# Patient Record
Sex: Female | Born: 1955 | Race: Black or African American | Hispanic: No | Marital: Married | Smoking: Current every day smoker
Health system: Southern US, Community
[De-identification: ages and names within clinical notes are randomized; demographics above are authoritative.]

## PROBLEM LIST (undated history)

## (undated) DIAGNOSIS — K219 Gastro-esophageal reflux disease without esophagitis: Secondary | ICD-10-CM

## (undated) DIAGNOSIS — E785 Hyperlipidemia, unspecified: Secondary | ICD-10-CM

## (undated) DIAGNOSIS — I10 Essential (primary) hypertension: Secondary | ICD-10-CM

## (undated) DIAGNOSIS — R7611 Nonspecific reaction to tuberculin skin test without active tuberculosis: Secondary | ICD-10-CM

## (undated) DIAGNOSIS — M1990 Unspecified osteoarthritis, unspecified site: Secondary | ICD-10-CM

## (undated) DIAGNOSIS — J449 Chronic obstructive pulmonary disease, unspecified: Secondary | ICD-10-CM

## (undated) DIAGNOSIS — T7840XA Allergy, unspecified, initial encounter: Secondary | ICD-10-CM

## (undated) HISTORY — DX: Chronic obstructive pulmonary disease, unspecified: J44.9

## (undated) HISTORY — DX: Gastro-esophageal reflux disease without esophagitis: K21.9

## (undated) HISTORY — DX: Essential (primary) hypertension: I10

## (undated) HISTORY — DX: Nonspecific reaction to tuberculin skin test without active tuberculosis: R76.11

## (undated) HISTORY — DX: Hyperlipidemia, unspecified: E78.5

## (undated) HISTORY — DX: Allergy, unspecified, initial encounter: T78.40XA

## (undated) HISTORY — DX: Unspecified osteoarthritis, unspecified site: M19.90

## (undated) HISTORY — PX: ABDOMINAL HYSTERECTOMY: SHX81

## (undated) HISTORY — PX: ANKLE SURGERY: SHX546

---

## 1998-01-17 ENCOUNTER — Emergency Department (HOSPITAL_COMMUNITY): Admission: EM | Admit: 1998-01-17 | Discharge: 1998-01-17 | Payer: Self-pay | Admitting: Emergency Medicine

## 1998-10-30 ENCOUNTER — Other Ambulatory Visit: Admission: RE | Admit: 1998-10-30 | Discharge: 1998-10-30 | Payer: Self-pay | Admitting: Gynecology

## 1999-01-20 ENCOUNTER — Emergency Department (HOSPITAL_COMMUNITY): Admission: EM | Admit: 1999-01-20 | Discharge: 1999-01-20 | Payer: Self-pay

## 1999-04-01 DIAGNOSIS — R7611 Nonspecific reaction to tuberculin skin test without active tuberculosis: Secondary | ICD-10-CM

## 1999-04-01 HISTORY — DX: Nonspecific reaction to tuberculin skin test without active tuberculosis: R76.11

## 2003-11-08 ENCOUNTER — Emergency Department (HOSPITAL_COMMUNITY): Admission: EM | Admit: 2003-11-08 | Discharge: 2003-11-08 | Payer: Self-pay | Admitting: Emergency Medicine

## 2004-07-01 ENCOUNTER — Ambulatory Visit: Payer: Self-pay | Admitting: Internal Medicine

## 2005-03-20 ENCOUNTER — Emergency Department (HOSPITAL_COMMUNITY): Admission: EM | Admit: 2005-03-20 | Discharge: 2005-03-20 | Payer: Self-pay | Admitting: Emergency Medicine

## 2005-09-23 ENCOUNTER — Emergency Department (HOSPITAL_COMMUNITY): Admission: EM | Admit: 2005-09-23 | Discharge: 2005-09-23 | Payer: Self-pay | Admitting: Emergency Medicine

## 2006-03-16 ENCOUNTER — Emergency Department (HOSPITAL_COMMUNITY): Admission: EM | Admit: 2006-03-16 | Discharge: 2006-03-17 | Payer: Self-pay | Admitting: Emergency Medicine

## 2006-04-29 ENCOUNTER — Emergency Department (HOSPITAL_COMMUNITY): Admission: EM | Admit: 2006-04-29 | Discharge: 2006-04-29 | Payer: Self-pay | Admitting: Emergency Medicine

## 2007-09-01 ENCOUNTER — Ambulatory Visit: Payer: Self-pay | Admitting: Cardiology

## 2007-09-01 ENCOUNTER — Observation Stay (HOSPITAL_COMMUNITY): Admission: EM | Admit: 2007-09-01 | Discharge: 2007-09-03 | Payer: Self-pay | Admitting: Emergency Medicine

## 2007-09-02 ENCOUNTER — Encounter: Payer: Self-pay | Admitting: Cardiology

## 2007-09-14 ENCOUNTER — Ambulatory Visit: Payer: Self-pay | Admitting: Cardiology

## 2007-09-14 ENCOUNTER — Ambulatory Visit (HOSPITAL_COMMUNITY): Admission: RE | Admit: 2007-09-14 | Discharge: 2007-09-14 | Payer: Self-pay | Admitting: Cardiology

## 2007-10-28 ENCOUNTER — Ambulatory Visit: Payer: Self-pay | Admitting: Cardiology

## 2007-12-14 ENCOUNTER — Ambulatory Visit: Payer: Self-pay | Admitting: Cardiology

## 2008-02-29 ENCOUNTER — Ambulatory Visit: Payer: Self-pay | Admitting: Cardiology

## 2008-05-30 ENCOUNTER — Ambulatory Visit: Payer: Self-pay | Admitting: Cardiology

## 2008-08-22 ENCOUNTER — Encounter: Payer: Self-pay | Admitting: Cardiology

## 2008-10-27 ENCOUNTER — Encounter (INDEPENDENT_AMBULATORY_CARE_PROVIDER_SITE_OTHER): Payer: Self-pay | Admitting: *Deleted

## 2008-11-28 DIAGNOSIS — I10 Essential (primary) hypertension: Secondary | ICD-10-CM

## 2008-11-28 DIAGNOSIS — E785 Hyperlipidemia, unspecified: Secondary | ICD-10-CM

## 2008-12-19 ENCOUNTER — Ambulatory Visit: Payer: Self-pay | Admitting: Cardiology

## 2008-12-19 DIAGNOSIS — R079 Chest pain, unspecified: Secondary | ICD-10-CM

## 2009-06-25 ENCOUNTER — Telehealth: Payer: Self-pay | Admitting: Cardiology

## 2009-09-25 ENCOUNTER — Telehealth: Payer: Self-pay | Admitting: Cardiology

## 2009-11-30 ENCOUNTER — Ambulatory Visit: Payer: Self-pay | Admitting: Cardiology

## 2010-04-30 NOTE — Progress Notes (Signed)
Summary: pt needs refill   Phone Note Refill Request Message from:  Patient on [facility]  Refills Requested: Medication #1:  HYDROCHLOROTHIAZIDE 25 MG TABS 1 tab once daily Initial call taken by: Omer Jack,  September 25, 2009 4:29 PM    Prescriptions: HYDROCHLOROTHIAZIDE 25 MG TABS (HYDROCHLOROTHIAZIDE) 1 tab once daily  #25 x 11   Entered by:   Danielle Rankin, CMA   Authorized by:   Gaylord Shih, MD, [facility]   Signed by:   Danielle Rankin, CMA on 09/26/2009   Method used:   Faxed to ...       [facility] (retail)             [place], [place]         Ph:        Fax: [phone]   RxID:   [id number]

## 2010-04-30 NOTE — Progress Notes (Signed)
Summary: Calling about medication  Medications Added PROTONIX 40 MG TBEC (PANTOPRAZOLE SODIUM) 1 tab once daily       Phone Note Call from Patient Call back at Home Phone [phone]   Caller: Patient Summary of Call: Pt terturning call baout medication Initial call taken by: Judie Grieve,  June 25, 2009 11:36 AM  Follow-up for Phone Call        Will have Okey Regal call pt back. Pt is returning phone call. Marrion Coy, CNA  June 25, 2009 2:46 PM  Follow-up by: Marrion Coy, CNA,  June 25, 2009 2:46 PM    New/Updated Medications: PROTONIX 40 MG TBEC (PANTOPRAZOLE SODIUM) 1 tab once daily Prescriptions: PROTONIX 40 MG TBEC (PANTOPRAZOLE SODIUM) 1 tab once daily  #30 x 11   Entered by:   Danielle Rankin, CMA   Authorized by:   Gaylord Shih, MD, [facility]   Signed by:   Danielle Rankin, CMA on 06/26/2009   Method used:   Faxed to ...       [facility] (retail)             [place], [place]         Ph:        Fax: [phone]   RxID:   [id number]

## 2010-04-30 NOTE — Assessment & Plan Note (Signed)
Summary: YEARLY F/U  /CY  Medications Added GAS-X 80 MG CHEW (SIMETHICONE) Take 1 tablet  at least twice a day        Visit Type:  1 yr f/u Primary Provider:  No PCP at this time  CC:  palpitations..pt c/o indigestion like feeling and says she takes her medicine for this but it does not help...sob....denies any edema...pt quit smoking about 6 mo ago...pt states she needs to find out why she is so sob lately.  History of Present Illness: Evelyn Mcdaniel comes in today for followup of her hypertension, history of tobacco use which she is now quit for 6 months, history of atypical chest pain.  Her biggest complaint is gas in her chest. She burps belches all day. She tried coming off the proton X. which made this worse.  She denies any reflux symptoms. She has no problems swallowing either foods or liquids. She's had no change in bowel habits and no melena or hematochezia. There is no history of peptic ulcer disease.  She denies any cough. She's had no wheezing or symptoms of aspiration.  Clinical Reports Reviewed:  Nuclear Study:  09/14/2007:  STRESS MYOVIEW:   The patient is a 55 year old female with past medical history of   hypertension and hyperlipidemia who is complaining of chest pain.   This study is performed to exclude ischemia.   The patient exercised for duration of nine minutes on the Bruce   protocol. Her heart rate at rest was 86 and increased to a maximum of   160.  Her blood pressure at rest was 128/94 and increased to 198/104,   which is thought to be a hypertensive response.  There was no chest   pain during the study, but there was dyspnea.  There were no   electrocardiographic changes. The study was terminated secondary to   fatigue.   This is a same-day rest/stress protocol.  30 mCi of Myoview were used   for the stress images and 10 mCi of Myoview were used for the rest   images.   Scintigraphic Results:  The images were reconstructed in the short   axis, as  well as the vertical and horizontal long axis.  The stress   images reveal a small defect at the apex.  However, when compared to   the rest images, there is no significant reversibility noted.  The   gated ejection fraction was 67 % and the wall motion was normal.   FINAL INTEPRETATION:   Stress Myoview with no chest pain and no electrocardiographic   changes.  There was a hypertensive response.  The scintigraphic   results show small prior apical infarct versus apical thinning.   There is no ischemia on this study.  The gated ejection fraction was   67% and the wall motion was normal.    Read By:  Olga Millers,  M.D.   Released By:  Olga Millers,  M.D.   Current Medications (verified): 1)  Hydrochlorothiazide 25 Mg Tabs (Hydrochlorothiazide) .Marland Kitchen.. 1 Tab Once Daily 2)  Aspirin 81 Mg Tbec (Aspirin) .... Take One Tablet By Mouth Daily 3)  Amlodipine Besylate 10 Mg Tabs (Amlodipine Besylate) .Marland Kitchen.. 1 Tab Once Daily 4)  Potassium Chloride Crys Cr 20 Meq Cr-Tabs (Potassium Chloride Crys Cr) .Marland Kitchen.. 1 Tab Once Daily 5)  Coricidin D 2-30-325 Mg Tabs (Chlorphen-Pseudoephed-Apap) .... As Needed 6)  Tylenol Extra Strength 500 Mg Tabs (Acetaminophen) .... As Needed 7)  Protonix 40 Mg Tbec (Pantoprazole Sodium) .Marland KitchenMarland KitchenMarland Kitchen  1 Tab Once Daily  Allergies: 1)  ! Morphine  Past History:  Past Medical History: Last updated: 11/28/2008 HYPERLIPIDEMIA (ICD-272.4) HYPERTENSION, UNSPECIFIED (ICD-401.9)  Past Surgical History: Last updated: 05/01/2008 Abdominal Hysterectomy-Total  Family History: Last updated: 05/01/2008 Family History of Hypertension:   Social History: Last updated: 05/01/2008 Full Time Married  Tobacco Use - No.  Alcohol Use - yes Regular Exercise - no Drug Use - no  Risk Factors: Exercise: no (05/01/2008)  Risk Factors: Smoking Status: never (05/01/2008)  Review of Systems       negative history of present illness  Vital Signs:  Patient profile:   55 year old  female Height:      62 inches Weight:      140.8 pounds BMI:     25.85 Pulse rate:   88 / minute Pulse rhythm:   irregular BP sitting:   122 / 90  (left arm) Cuff size:   large  Vitals Entered By: Danielle Rankin, CMA (November 30, 2009 3:43 PM)  Physical Exam  General:  Well developed, well nourished, in no acute distress. Head:  normocephalic and atraumatic Eyes:  PERRLA/EOM intact; conjunctiva and lids normal. Neck:  Neck supple, no JVD. No masses, thyromegaly or abnormal cervical nodes. Chest Wall:  no deformities or breast masses noted Lungs:  Clear bilaterally to auscultation and percussion. Heart:  PMI nondisplaced, normal S1-S2, no murmur or gallop. Carotids equal bilaterally without bruits. Abdomen:  Bowel sounds positive; abdomen soft and non-tender without masses, organomegaly, or hernias noted. No hepatosplenomegaly. Msk:  Back normal, normal gait. Muscle strength and tone normal. Pulses:  pulses normal in all 4 extremities Extremities:  No clubbing or cyanosis. Neurologic:  Alert and oriented x 3. Skin:  Intact without lesions or rashes. Psych:  Normal affect.   EKG  Procedure date:  11/30/2009  Findings:      normal sinus rhythm, RSR prime V1 and V2, left anterior fascicular block, no changes.  Impression & Recommendations:  Problem # 1:  CHEST PAIN UNSPECIFIED (ICD-786.50) Assessment New I suspect this is just gas with belching. It is clearly not cardiac. I recommended simethicone b.i.d. and continue protonic. She should eat regular small meals 3 times a day and don't skip meals. She needs to minimize caffeine. Her updated medication list for this problem includes:    Aspirin 81 Mg Tbec (Aspirin) .Marland Kitchen... Take one tablet by mouth daily    Amlodipine Besylate 10 Mg Tabs (Amlodipine besylate) .Marland Kitchen... 1 tab once daily  Problem # 2:  HYPERTENSION, UNSPECIFIED (ICD-401.9) Assessment: Improved  Her updated medication list for this problem includes:     Hydrochlorothiazide 25 Mg Tabs (Hydrochlorothiazide) .Marland Kitchen... 1 tab once daily    Aspirin 81 Mg Tbec (Aspirin) .Marland Kitchen... Take one tablet by mouth daily    Amlodipine Besylate 10 Mg Tabs (Amlodipine besylate) .Marland Kitchen... 1 tab once daily  Other Orders: EKG w/ Interpretation (93000)  Patient Instructions: 1)  Your physician recommends that you schedule a follow-up appointment in: 1 year with Dr. Daleen Squibb 2)  Your physician recommends that you continue on your current medications as directed. Please refer to the Current Medication list given to you today. 3)  Your physician discussed the hazards of tobacco use.  Continue  the good job in your smoking cessation. 4)  Remember to eat 3 small meals a day regularly and to REDUCE your CAFFEINE

## 2010-07-26 ENCOUNTER — Emergency Department (HOSPITAL_COMMUNITY): Payer: Self-pay

## 2010-07-26 ENCOUNTER — Emergency Department (HOSPITAL_COMMUNITY)
Admission: EM | Admit: 2010-07-26 | Discharge: 2010-07-27 | Disposition: A | Discharge status: Home / Self Care | Payer: Self-pay | Attending: Emergency Medicine | Admitting: Emergency Medicine

## 2010-07-26 DIAGNOSIS — I10 Essential (primary) hypertension: Secondary | ICD-10-CM | POA: Insufficient documentation

## 2010-07-26 DIAGNOSIS — M25569 Pain in unspecified knee: Secondary | ICD-10-CM | POA: Insufficient documentation

## 2010-07-30 ENCOUNTER — Encounter: Payer: Self-pay | Admitting: Cardiology

## 2010-08-13 NOTE — Discharge Summary (Signed)
Mcdaniel, Evelyn               ACCOUNT NO.:  [id number]   MEDICAL RECORD NO.:  [id number]          PATIENT TYPE:  OBV   LOCATION:  1436                         FACILITY:  [facility]   PHYSICIAN:  Rollene Rotunda, MD, FACCDATE OF BIRTH:  17-Dec-1955   DATE OF ADMISSION:  09/01/2007  DATE OF DISCHARGE:  09/03/2007                               DISCHARGE SUMMARY   PRIMARY CARDIOLOGIST:  Jonelle Sidle, MD   DISCHARGE DIAGNOSIS:  Chest pain.   SECONDARY DIAGNOSES:  1. Hypertensive urgency.  2. Medication noncompliance.  3. Marijuana abuse.  4. A 30-pack-year history of tobacco abuse, quitting about 6 months      ago.  5. Hyperlipidemia.  6. Status post hysterectomy in 1988.   ALLERGIES:  MORPHINE causes hives.   PROCEDURES:  A 2D echocardiogram performed, September 02, 2007, showing an EF  of 65-75% with mild-to-moderate LVH.   HISTORY OF PRESENT ILLNESS:  A 55 year old African-American female with  prior history of hypertension and medication noncompliance, who came off  of all of her medicines about a year ago.  She was in her usual state of  health until the evening prior to admission when she developed what she  felt was indigestion, relieved by Mylanta.  On the morning of admission,  she was in her car and driving, had sudden onset of severe pressure and  vice-like pain in the midsternal area associated with shortness of  breath, dizziness, and diaphoresis.  She drove directly to the emergency  room at [facility], where she was found to be hypertensive with blood  pressure 174/112.  She was treated with aspirin and clonidine with  reduction in blood pressure of 125/95, and she was admitted to the  [facility] for further evaluation.   HOSPITAL COURSE:  1. The patient ruled out for an MI.  2. Echocardiogram on September 02, 2007, which showed normal LV function      without regional wall motion abnormalities.  She has had no      recurrent chest discomfort and has  been initiated on calcium-      channel blocker, diuretic, and ACE inhibitor therapy.  She has      tolerated these medications well and her blood pressures remained      stable.  She will be discharged home today in good condition and we      have arranged for her to have an exercise Myoview on September 14, 2007,      at 10 a.m.   DISCHARGE LABS:  Hemoglobin 14.0, hematocrit 40.5, WBC 6.9, and  platelets 168.  Sodium 138, potassium 3.6, chloride 101, CO2 30, BUN 10,  creatinine 0.79, glucose 87, total bilirubin 0.7, alkaline phosphate 94,  AST 16, ALT 14, total protein 7.1, albumin 4.1, calcium 9.5, magnesium  2.3, CK 71, MB 1.6, troponin I 0.02, total cholesterol 230,  triglycerides 143, HDL 45, LDL 156, and TSH 1.300.  Urine drug screen  was positive for THC.  Urinalysis negative.   DISPOSITION:  The patient is being discharged home today in good  condition.   FOLLOWUP PLANS  AND APPOINTMENT:  She will undergo exercise stress  testing on September 14, 2007, at 10 a.m. at [facility].  She will  follow up with Dr. Lilian Kapur on September 28, 2007, at 2:15 p.m.   DISCHARGE MEDICATIONS:  1. Lotensin 10 mg every day.  2. Amlodipine 5 mg ever day.  3. HCTZ 25 mg every day.  4. Potassium citrate 20 mEq every day.  5. Aspirin 81 mg every day.   OUTSTANDING LAB STUDIES:  None.   DURATION OF DISCHARGE/ENCOUNTER:  Forty minutes including physician  time.      Nicolasa Ducking, ANP      Rollene Rotunda, MD, [facility]  Electronically Signed    CB/MEDQ  D:  09/03/2007  T:  09/04/2007  Job:  [id number]

## 2010-08-13 NOTE — H&P (Signed)
Evelyn Mcdaniel, Evelyn Mcdaniel               ACCOUNT NO.:  [id number]   MEDICAL RECORD NO.:  [id number]          PATIENT TYPE:  OBV   LOCATION:  1436                         FACILITY:  [facility]   PHYSICIAN:  Jonelle Sidle, MD DATE OF BIRTH:  Jan 09, 1956   DATE OF ADMISSION:  09/01/2007  DATE OF DISCHARGE:                              HISTORY & PHYSICAL   PRIMARY CARE PHYSICIAN:  None.   HISTORY OF PRESENT ILLNESS:  This is a 55 year old African American  female with no prior cardiac history, presenting to the ER after  experiencing chest discomfort beginning last night, which she describes  as heartburn.  She took Mylanta, which helped.  This morning, while  driving to the store, she felt severe pressure, vise-like pain from her  ribs pressing in midsternally with associated shortness of breath and  dizziness along with diaphoresis.  She turned off the air conditioner,  rested and drove to the emergency room.  On arrival in the emergency  room, the patient was found to be hypertensive with blood pressure of  174/112.  The patient was given aspirin and started on nitroglycerin  drip and given 1 dose of clonidine 0.1 mg.  The patient after  approximately 1 to 1-1/2 hours, began to feel better and blood pressure  has improved and it is now 125/95.  The patient continues to have a  headache, but pressure in her chest has diminished significantly.  We  are asked to evaluate her further.   REVIEW OF SYSTEMS:  Positive for headache, chest pain, shortness of  breath, no palpitations.  Denies nausea, vomiting, diaphoresis, or  dizziness.  Otherwise negative.   PAST MEDICAL HISTORY:  Hypertension (on no medications).   SOCIAL HISTORY:  The patient lives in [place] with her husband.  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  She is  married, with one son.  A 30-pack-year tobacco use, but stopped  approximately 6 months ago, occasional EtOH.  She denies drug use.   PAST  SURGICAL HISTORY:  Hysterectomy in 1988.   FAMILY HISTORY:  Mother with hypertension.  Father with hypertension.  Brother with a heart murmur and hypertension.   CURRENT LABS:  Sodium 141, potassium 3.6, chloride 104, CO2 30, BUN 11,  creatinine 0.74, and glucose 98.  Hemoglobin 14.0, hematocrit 40.5,  white blood cells 6.9, and platelets 168.  AST 16, ALT 14, total protein  7.1, albumin 4.1, CK 27.3, MB less than 1.0, troponin less than 0.05,  calcium 9.4.  Chest x-ray reveal no active disease.  EKG revealing  normal sinus rhythm, ventricular rate of 84 beats per minute with PR  interval of 0.21.   PHYSICAL EXAMINATION:  VITALS SIGNS:  Blood pressure 125/96, pulse 83,  respirations 16, temperature 98.1, and O2 sat 97% on 2 liters.  HEENT:  Head is normocephalic and atraumatic.  Eyes, PERRLA.  Mucous  membranes, mouth pink and moist.  Tongue is midline.  NECK:  Supple without JVD.  No carotid bruits appreciated.  No  thyromegaly is palpated.  CARDIOVASCULAR:  Regular rate and rhythm  with 1/6 soft systolic murmur  auscultated.  No S4 murmur is auscultated.  Pulses are 2+ and equal  bilaterally without bruits.  LUNGS:  Clear to auscultation.  ABDOMEN:  Soft and nontender.  No bruits are noted.  There is no rebound  or guarding.  EXTREMITIES:  Without clubbing, cyanosis, or edema.  NEUROLOGIC:  Cranial nerves II through XII are grossly intact.   IMPRESSION:  1. Chest pain.  2. Hypertensive urgency.  3. Medication noncompliance.   PLAN:  The patient has been seen and examined by myself and Dr. Nona Dell at [facility] ER.  The patient will be admitted to 24-hour  observation as she is a 55 year old female with tobacco abuse and  uncontrolled hypertension presenting with new onset chest pain.  The  patient will be started on Norvasc 5 mg 1 p.o. daily along with  hydrochlorothiazide 25 mg once a day and potassium.  Plan echocardiogram  for LV function, monitor the blood  pressure response to medications and  wean nitroglycerin.  The patient will likely need outpatient stress  testing if she stabilizes and cardiac markers are normal.  Primary care  follow-up will also be needed.      Bettey Mare. Lyman Bishop, NP      Jonelle Sidle, MD  Electronically Signed    KML/MEDQ  D:  09/01/2007  T:  09/02/2007  Job:  [id number]

## 2010-08-13 NOTE — Assessment & Plan Note (Signed)
[facility]                            CARDIOLOGY OFFICE NOTE   Mcdaniel, Evelyn Mcdaniel                      MRN:          [id number]  DATE:12/19/2008                            DOB:          07-Jan-1956    Evelyn Mcdaniel returns today for followup of her hypertension and atypical  chest pain.   She is still having chest pain several times a day under her left  breast.  She is wondering if she needs a breast exam.  She has not had a  mammogram in 2 years.  I have suggested follow up for that at the same  imaging center she had it done before.  I have also recommended monthly  breast exams.   She denies any radiation or discomfort.  It is not described as anginal  or chest tightness or pressure.  It is not associated with exertion.  She denies any fever or chills.  She has no cough, no hemoptysis.  It  does not sound like pleuritic chest pain.   She continues not to smoke!  She is currently on good blood pressure  medicines and apparently her blood pressure has been under good control.   CURRENT MEDICATIONS:  1. Hydrochlorothiazide 25 mg a day.  2. Aspirin 81 mg a day.  3. Amlodipine 10 mg per day.  4. Mylanta p.r.n. for reflux.  5. Potassium 20 mEq a day.  6. Coricidin as needed.  7. Tussin as needed.  8. Tylenol as needed.   PHYSICAL EXAMINATION:  GENERAL:  She is in no acute distress.  VITAL SIGNS:  Her blood pressure is 100/80 in the left arm, her pulse is  80 and regular, she is 62 inches and weighs 141 pounds.  Her BMI is  25.8.  HEENT:  Unchanged and unremarkable.  NECK:  Supple.  Carotid upstrokes were equal bilaterally without bruits.  There is no thyroid enlargement.  Trachea is midline.  LUNGS:  Clear to  auscultation and percussion without rhonchi or wheezes.  HEART:  Poorly appreciated PMI.  Soft S1 and S2.  No obvious murmur,  rub, or gallop.  ABDOMEN:  Soft, good bowel sounds.  No midline bruit.  No hepatomegaly.  EXTREMITIES:  No  cyanosis, clubbing, or edema.  Pulses are intact.  NEUROLOGIC:  Intact.   Her EKG shows sinus rhythm with biatrial enlargement and pulmonary  disease.  Pattern of left anterior fascicular block unchanged from  before.   ASSESSMENT AND PLAN:  Evelyn Mcdaniel blood pressure is under excellent  control.  Her chest pain is difficult to sort out, but I have  recommended she get a mammogram and followup with her OB/GYN.  We will  see her back in a year or p.r.n.     Thomas C. Daleen Squibb, MD, [facility]  Electronically Signed    TCW/MedQ  DD: 12/19/2008  DT: 12/20/2008  Job #: [id number]

## 2010-08-13 NOTE — Assessment & Plan Note (Signed)
[facility]                            CARDIOLOGY OFFICE NOTE   NAME:Gottsch, Evelyn Mcdaniel                      MRN:          [id number]  DATE:12/14/2007                            DOB:          1955/10/27    Evelyn Mcdaniel returns today for difficulty to control hypertension.  On  her last visit, we increased her amlodipine to 10 mg a day.  We also  wrote her for pantoprazole for reflux symptoms.   Her blood pressure is much better today at 126/85, her pulse is 90 and  regular, and her weight is 128.  Her exam is unchanged.  She has no  peripheral edema.   She is complaining that the medication is making her feel perhaps tired.  She is also worried about it diminishing her sex drive.  I have tried to  reassure her that Lotensin, amlodipine, and hydrochlorothiazide rarely  do this.  I told her that specifically while we did not use a beta  blocker or an alpha blocker.   She will continue with her medications.  She is delighted with blood  pressure control.  We will plan on seeing her back in 3 months.     Thomas C. Daleen Squibb, MD, [facility]  Electronically Signed    TCW/MedQ  DD: 12/14/2007  DT: 12/15/2007  Job #: [id number]

## 2010-08-13 NOTE — Assessment & Plan Note (Signed)
[facility]                            CARDIOLOGY OFFICE NOTE   NAME:Evelyn Mcdaniel, Evelyn Mcdaniel                      MRN:          [id number]  DATE:10/28/2007                            DOB:          [birth year]    Evelyn Mcdaniel comes in today for followup.  She was admitted with chest  pain, ruled out for myocardial infarction.  She was noted to be  hypertensive during her hospital stay with pressures up to 174/112.   She is a smoker.  Her total cholesterol was 230, HDL 45, LDL 156.  TSH  was normal.  Her urine drug screen was positive for THC.   She was placed on Lotensin 10 mg every day, amlodipine 5 mg every day,  HCTZ 25 mg a day, potassium 20 mEq a day and an aspirin 81 mg a day.   She had a stress Myoview, which showed a hypertensive blood pressure  response up to 198/104 at 9 minutes per Bruce protocol.  Her heart rate  achieved 160.  There were no EKG changes.  She had an EF of 67% with  normal wall motion.  There was some mild apical thinning but no  ischemia.   Since discharge, she continues to have a lot of chest pain, which seems  to be related to eating and to burping and belching.  She is also had  some numbness in her left leg and left arm.   She still smokes, but has cut way back.   Her meds are unchanged since her discharge.   PHYSICAL EXAMINATION:  Her blood is pressure still elevated but better  at 144/88, her pulse is 88 and regular.  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  HEENT:  Unchanged.  NECK:  Carotids upstrokes are equal bilateral without bruits, no JVD.  Thyroid is not enlarged.  Trachea is midline.  LUNGS:  Clear.  HEART:  Regular rate and rhythm.  No gallop and no rub.  ABDOMEN:  Soft.  EXTREMITIES:  No edema.  Pulses are intact.   I had a long talk with Evelyn Mcdaniel today.  I have strongly urged to stop  smoking for fear for having some coronary vasospasm or  even some  cerebral spasm.  I have written her a prescription for Chantix and also  increased her amlodipine to 10 mg a day with her hypertension and  hypertensive blood pressure response with a question of spasm as well.  We will continue other medications.  At this time, I would continue  daily aspirin 81 mg a day.   I have also given her pantoprazole 40 mg a day for 4 weeks for her  reflux.   I will see her back at that time.     Thomas C. Daleen Squibb, MD, [facility]  Electronically Signed    TCW/MedQ  DD: 10/28/2007  DT: 10/29/2007  Job #: [id number]

## 2010-08-13 NOTE — Assessment & Plan Note (Signed)
[facility]                            CARDIOLOGY OFFICE NOTE   NAME:Fussner, DANYA SPEARMAN                      MRN:          [id number]  DATE:05/30/2008                            DOB:          [birth year]    Deltha comes in today for followup.  She has had some atypical chest  pain.  She has quit smoking.  She is on Chantix.  She says she feels  remarkably better, has more energy, and less shortness of breath.   Her cough got better off of the Accupril.  She has not filled her Diovan  yet.   Her hypertensive drugs are amlodipine 10 mg per day and  hydrochlorothiazide 25 mg per day.   PHYSICAL EXAMINATION:  VITAL SIGNS:  Her blood pressure today is  excellent at 98/62.  Her pulse is 82 and regular.  Her weight is 139, up  4.  LUNGS:  Clear without rhonchi or wheezes.  HEART:  Regular rate and rhythm.  No gallop.  ABDOMEN:  Soft.  EXTREMITIES:  No edema.  Pulses are intact.  NEUROLOGIC:  Intact.   Her electrocardiogram is unchanged.   I had a long talk with Ms. Dudzinski today.  I am extremely pleased that  she has quit smoking.  She obviously feels better and has had some  positive reinforcement.  Her blood pressure is superb.  I have asked her  not to start the Diovan.  I will see her back in 6 months.     Thomas C. Daleen Squibb, MD, [facility]  Electronically Signed    TCW/MedQ  DD: 05/30/2008  DT: 05/31/2008  Job #: [id number]

## 2010-08-13 NOTE — Assessment & Plan Note (Signed)
[facility]                            CARDIOLOGY OFFICE NOTE   NAME:Mcdaniel, Evelyn PITTER                      MRN:          [id number]  DATE:02/29/2008                            DOB:          [birth year]    [name] comes in today for her hypertension and history of tobacco  use.   She had been having a dry nagging cough.  She has had a cold recently,  but she is really concerned that it could be the Accupril.   She denies any fever, chills, or coughing up any blood.  She does have  some sputum production, which is clear.   Her blood pressure has been under great control.  She has been very  careful with salt, which has made a big difference, she says.   Her current meds are:  1. Accupril 10 mg a day.  2. Amlodipine 10 mg a day.  3. Aspirin 81 mg a day.  4. Potassium 20 mEq a day.  5. Hydrochlorothiazide 25 mg a day.   Her blood pressure today is 126/94, pulse is 80 and regular, weight is  135.  HEENT is unchanged.  She does have a cough that is fairly  productive of clear sputum.  She has some rhonchi that cleared with  coughing.  Neck shows no JVD.  Carotids upstrokes were equal bilateral  without bruits.  No thyromegaly.  Trachea is midline.  Heart reveals a  regular rate and rhythm.  Abdominal exam is soft, good bowel sounds.  Extremities have no edema.  Pulses are intact.  Neuro exam is intact.   I had a long talk with [name] today.  I have made the following  recommendations:  1. Start her Chantix and cut back on smoking, which will help her      cough.  She clearly has chronic bronchitis, which I shared with her      today.  2. Renew her amlodipine.  3. Stop her Accupril.  She will follow her blood pressures.  If her      pressure is under good control, we will just stay off Accupril.      She will note whether or not her cough improves, however.  If her      blood pressure goes back up and her cough has not improved off  Accupril, she will go back on it.   I will plan on seeing her back again in 6 months.     Thomas C. Daleen Squibb, MD, [facility]  Electronically Signed   TCW/MedQ  DD: 02/29/2008  DT: 03/01/2008  Job #: [id number]

## 2010-12-05 ENCOUNTER — Ambulatory Visit: Payer: Self-pay | Admitting: Cardiology

## 2010-12-13 ENCOUNTER — Ambulatory Visit (INDEPENDENT_AMBULATORY_CARE_PROVIDER_SITE_OTHER): Payer: Self-pay | Admitting: Cardiology

## 2010-12-13 ENCOUNTER — Encounter: Payer: Self-pay | Admitting: Cardiology

## 2010-12-13 VITALS — BP 120/82 | HR 76 | Ht 62.0 in | Wt 142.0 lb

## 2010-12-13 DIAGNOSIS — E785 Hyperlipidemia, unspecified: Secondary | ICD-10-CM

## 2010-12-13 DIAGNOSIS — I10 Essential (primary) hypertension: Secondary | ICD-10-CM

## 2010-12-13 LAB — LIPID PANEL
Total CHOL/HDL Ratio: 4
VLDL: 26 mg/dL (ref 0.0–40.0)

## 2010-12-13 LAB — BASIC METABOLIC PANEL
BUN: 11 mg/dL (ref 6–23)
CO2: 28 mEq/L (ref 19–32)
Chloride: 103 mEq/L (ref 96–112)
Creatinine, Ser: 0.6 mg/dL (ref 0.4–1.2)

## 2010-12-13 LAB — HEPATIC FUNCTION PANEL
Alkaline Phosphatase: 88 U/L (ref 39–117)
Bilirubin, Direct: 0 mg/dL (ref 0.0–0.3)
Total Protein: 7.2 g/dL (ref 6.0–8.3)

## 2010-12-13 NOTE — Assessment & Plan Note (Signed)
Blood pressure under good control. No change recommendations. Advised to avoid ibuprofen as much as possible. Check chemistries today including renal function. I've asked her to get a primary care physician since her blood pressure is well controlled now and her blood work can be followed by a good primary care provider. She has agreed to do so. I will see her back p.r.n.

## 2010-12-13 NOTE — Patient Instructions (Signed)
Your physician recommends that you return for lab work today fasting cholesterol,cmp.  We will call you with your lab results  Avoid taking ibuprofen as much as possible.  Your doctor has recommended that you follow-up in the next 3-6 months with a primary care physician.

## 2010-12-13 NOTE — Progress Notes (Signed)
HPI Evelyn Mcdaniel returns for evaluation  And management of her hypertension and hyperlipidemia.  Medications reviewed. She only takes pantoprazole p.r.n. She is taking all of her other medications as prescribed.  She denies any chest discomfort consistent with angina. She does get dyspnea on exertion if she runs. She still smokes but is cut way back. She is to have blood work today.  She does not have a primary care physician. She has no health insurance.  EKG today is normal sinus rhythm with low voltage QRS, left anterior fascicular block Past Medical History  Diagnosis Date  . Hyperlipidemia   . Hypertension     Past Surgical History  Procedure Date  . Abdominal hysterectomy     No family history on file.  History   Social History  . Marital Status: Married    Spouse Name: N/A    Number of Children: N/A  . Years of Education: N/A   Occupational History  . Not on file.   Social History Main Topics  . Smoking status: Current Some Day Smoker  . Smokeless tobacco: Never Used  . Alcohol Use: Yes  . Drug Use: No  . Sexually Active: Not on file   Other Topics Concern  . Not on file   Social History Narrative  . No narrative on file    Allergies  Allergen Reactions  . Morphine     REACTION: hives  . Morphine And Related     Current Outpatient Prescriptions  Medication Sig Dispense Refill  . amLODipine (NORVASC) 10 MG tablet Take 10 mg by mouth daily.        Marland Kitchen aspirin 81 MG EC tablet Take 81 mg by mouth daily.        . hydrochlorothiazide 25 MG tablet Take 25 mg by mouth daily.        . Ibuprofen (ADVIL PO) Take 1 tablet by mouth as needed.        . pantoprazole (PROTONIX) 40 MG tablet Take 40 mg by mouth daily.        . potassium chloride SA (K-DUR,KLOR-CON) 20 MEQ tablet Take 20 mEq by mouth daily.          ROS Negative other than HPI.   PE General Appearance: well developed, well nourished in no acute distress HEENT: symmetrical face, PERRLA, good  dentition  Neck: no JVD, thyromegaly, or adenopathy, trachea midline Chest: symmetric without deformity Cardiac: PMI non-displaced, RRR, normal S1, S2, no gallop or murmur Lung: clear to ausculation and percussion Vascular: all pulses full without bruits  Abdominal: nondistended, nontender, good bowel sounds, no HSM, no bruits Extremities: no cyanosis, clubbing or edema, no sign of DVT, no varicosities  Skin: normal color, no rashes Neuro: alert and oriented x 3, non-focal Pysch: normal affect Filed Vitals:   12/13/10 0928  BP: 120/82  Pulse: 76  Height: 5\' 2"  (1.575 m)  Weight: 142 lb (64.411 kg)    EKG  Labs and Studies Reviewed.   No results found for this basename: WBC, HGB, HCT, MCV, PLT      Chemistry   No results found for this basename: NA, K, CL, CO2, BUN, CREATININE, GLU   No results found for this basename: CALCIUM, ALKPHOS, AST, ALT, BILITOT       No results found for this basename: CHOL   No results found for this basename: HDL   No results found for this basename: LDLCALC   No results found for this basename: TRIG   No  results found for this basename: CHOLHDL   No results found for this basename: HGBA1C   No results found for this basename: ALT, AST, GGT, ALKPHOS, BILITOT   No results found for this basename: TSH

## 2010-12-13 NOTE — Assessment & Plan Note (Signed)
Check labs today. Goal LDL less than 100. We'll also check LFTs.

## 2010-12-23 ENCOUNTER — Encounter: Payer: Self-pay | Admitting: *Deleted

## 2010-12-23 ENCOUNTER — Telehealth: Payer: Self-pay | Admitting: Cardiology

## 2010-12-23 DIAGNOSIS — E785 Hyperlipidemia, unspecified: Secondary | ICD-10-CM

## 2010-12-23 MED ORDER — ATORVASTATIN CALCIUM 20 MG PO TABS: 20.0000 mg | ORAL_TABLET | Freq: Every day | ORAL | Status: DC

## 2010-12-23 NOTE — Telephone Encounter (Signed)
Pt aware of cholesterol results. Atorvastatin 20mg  prescription sent to [facility]. Pt will return for labs on 02/04/11 Copy mailed to pt with lab appt. Mylo Red RN

## 2010-12-23 NOTE — Telephone Encounter (Signed)
Returning your call about tests results

## 2010-12-26 LAB — CBC
HCT: 40.5
MCV: 93.7
Platelets: 168
RDW: 13

## 2010-12-26 LAB — MAGNESIUM: Magnesium: 2.3

## 2010-12-26 LAB — CARDIAC PANEL(CRET KIN+CKTOT+MB+TROPI)
CK, MB: 1.5
CK, MB: 1.6
Relative Index: INVALID
Total CK: 71
Troponin I: 0.02

## 2010-12-26 LAB — BASIC METABOLIC PANEL
GFR calc Af Amer: >60
GFR calc non Af Amer: >60
Potassium: 3.6
Sodium: 138

## 2010-12-26 LAB — DIFFERENTIAL
Basophils Relative: 1
Eosinophils Absolute: 0.1
Eosinophils Relative: 1
Lymphocytes Relative: 30
Monocytes Absolute: 0.5
Monocytes Relative: 8
Neutro Abs: 4.2

## 2010-12-26 LAB — COMPREHENSIVE METABOLIC PANEL
Albumin: 4.1
BUN: 11
Creatinine, Ser: 0.74
Total Protein: 7.1

## 2010-12-26 LAB — POCT CARDIAC MARKERS
CKMB, poc: <1 — ABNORMAL LOW
Myoglobin, poc: 27.3
Operator id: 280141

## 2010-12-26 LAB — CK TOTAL AND CKMB (NOT AT ARMC): Relative Index: INVALID

## 2010-12-26 LAB — URINALYSIS, ROUTINE W REFLEX MICROSCOPIC
Nitrite: NEGATIVE
Specific Gravity, Urine: 1.009
pH: 6.5

## 2010-12-26 LAB — D-DIMER, QUANTITATIVE: D-Dimer, Quant: 0.24

## 2010-12-26 LAB — TSH: TSH: 1.3

## 2010-12-26 LAB — RAPID URINE DRUG SCREEN, HOSP PERFORMED
Cocaine: NOT DETECTED
Opiates: NOT DETECTED

## 2010-12-26 LAB — LIPID PANEL: HDL: 45

## 2011-02-04 ENCOUNTER — Telehealth: Payer: Self-pay | Admitting: Cardiology

## 2011-02-04 ENCOUNTER — Other Ambulatory Visit (INDEPENDENT_AMBULATORY_CARE_PROVIDER_SITE_OTHER): Payer: Self-pay | Admitting: *Deleted

## 2011-02-04 ENCOUNTER — Other Ambulatory Visit: Payer: Self-pay | Admitting: Cardiology

## 2011-02-04 ENCOUNTER — Other Ambulatory Visit: Payer: Self-pay | Admitting: *Deleted

## 2011-02-04 DIAGNOSIS — E785 Hyperlipidemia, unspecified: Secondary | ICD-10-CM

## 2011-02-04 LAB — LIPID PANEL
Cholesterol: 214 mg/dL — ABNORMAL HIGH (ref 0–200)
Total CHOL/HDL Ratio: 4
Triglycerides: 136 mg/dL (ref 0.0–149.0)
VLDL: 27.2 mg/dL (ref 0.0–40.0)

## 2011-02-04 LAB — HEPATIC FUNCTION PANEL
AST: 16 U/L (ref 0–37)
Albumin: 4.1 g/dL (ref 3.5–5.2)

## 2011-02-04 LAB — LDL CHOLESTEROL, DIRECT: Direct LDL: 146.6 mg/dL

## 2011-02-04 MED ORDER — HYDROCHLOROTHIAZIDE 25 MG PO TABS: 25.0000 mg | ORAL_TABLET | Freq: Every day | ORAL | Status: DC

## 2011-02-04 NOTE — Telephone Encounter (Signed)
Pt need refill of hctz 25 mg to [facility] [phone]

## 2011-02-18 ENCOUNTER — Telehealth: Payer: Self-pay | Admitting: Cardiology

## 2011-02-18 NOTE — Telephone Encounter (Signed)
New Msg: Pt call wanting to know results of lab work. Please return pt call to discuss further.

## 2011-02-18 NOTE — Telephone Encounter (Signed)
LMTCB Debbie Peretz Thieme RN  

## 2011-04-03 ENCOUNTER — Other Ambulatory Visit: Payer: Self-pay | Admitting: *Deleted

## 2011-04-03 MED ORDER — AMLODIPINE BESYLATE 10 MG PO TABS: 10.0000 mg | ORAL_TABLET | Freq: Every day | ORAL | Status: DC

## 2011-04-03 MED ORDER — PANTOPRAZOLE SODIUM 40 MG PO TBEC: 40.0000 mg | DELAYED_RELEASE_TABLET | Freq: Every day | ORAL | Status: DC

## 2011-04-16 ENCOUNTER — Other Ambulatory Visit: Payer: Self-pay | Admitting: *Deleted

## 2011-04-16 MED ORDER — PANTOPRAZOLE SODIUM 40 MG PO TBEC: 40.0000 mg | DELAYED_RELEASE_TABLET | Freq: Every day | ORAL | Status: DC

## 2011-05-09 ENCOUNTER — Other Ambulatory Visit: Payer: Self-pay

## 2011-05-09 MED ORDER — PANTOPRAZOLE SODIUM 40 MG PO TBEC: 40.0000 mg | DELAYED_RELEASE_TABLET | Freq: Every day | ORAL | Status: DC

## 2011-05-09 MED ORDER — AMLODIPINE BESYLATE 10 MG PO TABS: 10.0000 mg | ORAL_TABLET | Freq: Every day | ORAL | Status: DC

## 2011-06-10 ENCOUNTER — Other Ambulatory Visit: Payer: Self-pay

## 2011-06-10 MED ORDER — AMLODIPINE BESYLATE 10 MG PO TABS: 10.0000 mg | ORAL_TABLET | Freq: Every day | ORAL | Status: DC

## 2011-08-11 ENCOUNTER — Other Ambulatory Visit: Payer: Self-pay | Admitting: Cardiology

## 2011-08-11 MED ORDER — AMLODIPINE BESYLATE 10 MG PO TABS: 10.0000 mg | ORAL_TABLET | Freq: Every day | ORAL | Status: DC

## 2011-09-30 ENCOUNTER — Other Ambulatory Visit: Payer: Self-pay | Admitting: *Deleted

## 2011-09-30 MED ORDER — ATORVASTATIN CALCIUM 20 MG PO TABS: 20.0000 mg | ORAL_TABLET | Freq: Every day | ORAL | Status: DC

## 2011-09-30 NOTE — Telephone Encounter (Signed)
Pt needs appointment then refill can be made Fax Received. Refill Completed. Jonel Sick Chowoe (R.M.A)   

## 2011-09-30 NOTE — Telephone Encounter (Signed)
Pt needs appointment then refill can be made 

## 2011-11-26 ENCOUNTER — Telehealth: Payer: Self-pay | Admitting: *Deleted

## 2011-11-26 NOTE — Telephone Encounter (Signed)
LMTCB for yearly appt & fasting lab work. Refill for protonix sent in. Mylo Red RN

## 2012-01-01 ENCOUNTER — Other Ambulatory Visit: Payer: Self-pay | Admitting: Cardiology

## 2012-01-01 MED ORDER — ATORVASTATIN CALCIUM 20 MG PO TABS: 20.0000 mg | ORAL_TABLET | Freq: Every day | ORAL | Status: DC

## 2012-02-11 ENCOUNTER — Other Ambulatory Visit: Payer: Self-pay | Admitting: *Deleted

## 2012-02-25 ENCOUNTER — Other Ambulatory Visit: Payer: Self-pay

## 2012-02-25 MED ORDER — ATORVASTATIN CALCIUM 20 MG PO TABS: 20.0000 mg | ORAL_TABLET | Freq: Every day | ORAL | Status: DC

## 2012-03-04 ENCOUNTER — Encounter (HOSPITAL_COMMUNITY): Payer: Self-pay | Admitting: *Deleted

## 2012-03-04 ENCOUNTER — Emergency Department (HOSPITAL_COMMUNITY): Payer: Self-pay

## 2012-03-04 ENCOUNTER — Emergency Department (HOSPITAL_COMMUNITY)
Admission: EM | Admit: 2012-03-04 | Discharge: 2012-03-04 | Disposition: A | Discharge status: Home / Self Care | Payer: Self-pay | Attending: Emergency Medicine | Admitting: Emergency Medicine

## 2012-03-04 DIAGNOSIS — I10 Essential (primary) hypertension: Secondary | ICD-10-CM | POA: Insufficient documentation

## 2012-03-04 DIAGNOSIS — Z79899 Other long term (current) drug therapy: Secondary | ICD-10-CM | POA: Insufficient documentation

## 2012-03-04 DIAGNOSIS — E876 Hypokalemia: Secondary | ICD-10-CM | POA: Insufficient documentation

## 2012-03-04 DIAGNOSIS — R000 Tachycardia, unspecified: Secondary | ICD-10-CM | POA: Insufficient documentation

## 2012-03-04 DIAGNOSIS — F17200 Nicotine dependence, unspecified, uncomplicated: Secondary | ICD-10-CM | POA: Insufficient documentation

## 2012-03-04 DIAGNOSIS — E785 Hyperlipidemia, unspecified: Secondary | ICD-10-CM | POA: Insufficient documentation

## 2012-03-04 LAB — URINALYSIS, ROUTINE W REFLEX MICROSCOPIC
Bilirubin Urine: NEGATIVE
Nitrite: NEGATIVE
Specific Gravity, Urine: 1.017 (ref 1.005–1.030)
Urobilinogen, UA: 1 mg/dL (ref 0.0–1.0)

## 2012-03-04 LAB — COMPREHENSIVE METABOLIC PANEL
ALT: 12 U/L (ref 0–35)
AST: 21 U/L (ref 0–37)
Albumin: 3.9 g/dL (ref 3.5–5.2)
Alkaline Phosphatase: 89 U/L (ref 39–117)
Chloride: 96 mEq/L (ref 96–112)
Potassium: 3 mEq/L — ABNORMAL LOW (ref 3.5–5.1)
Sodium: 134 mEq/L — ABNORMAL LOW (ref 135–145)
Total Bilirubin: 0.2 mg/dL — ABNORMAL LOW (ref 0.3–1.2)

## 2012-03-04 LAB — CBC WITH DIFFERENTIAL/PLATELET
Basophils Absolute: 0 10*3/uL (ref 0.0–0.1)
Basophils Relative: 0 % (ref 0–1)
Hemoglobin: 13.5 g/dL (ref 12.0–15.0)
MCHC: 34.9 g/dL (ref 30.0–36.0)
Monocytes Relative: 11 % (ref 3–12)
Neutro Abs: 5.1 10*3/uL (ref 1.7–7.7)
Neutrophils Relative %: 68 % (ref 43–77)
RDW: 12.7 % (ref 11.5–15.5)

## 2012-03-04 MED ORDER — POTASSIUM CHLORIDE CRYS ER 20 MEQ PO TBCR
40.0000 meq | EXTENDED_RELEASE_TABLET | Freq: Once | ORAL | Status: AC
  Administered 2012-03-04: 40 meq via ORAL
  Filled 2012-03-04: qty 2

## 2012-03-04 MED ORDER — SODIUM CHLORIDE 0.9 % IV SOLN
Freq: Once | INTRAVENOUS | Status: AC
  Administered 2012-03-04: 18:00:00 via INTRAVENOUS

## 2012-03-04 MED ORDER — KETOROLAC TROMETHAMINE 30 MG/ML IJ SOLN
30.0000 mg | Freq: Once | INTRAMUSCULAR | Status: AC
  Administered 2012-03-04: 30 mg via INTRAVENOUS
  Filled 2012-03-04: qty 1

## 2012-03-04 NOTE — ED Provider Notes (Signed)
History     CSN: [id number]  Arrival date & time 03/04/12  1423   First MD Initiated Contact with Patient 03/04/12 1722      Chief Complaint  Patient presents with  . Weakness    (Consider location/radiation/quality/duration/timing/severity/associated sxs/prior treatment) HPI  The patient presents with multiple complaints.  She states that and told to was in her usual state of health.  Since that time she's developed pain in her chest, legs, back.  She notes associated weakness, fevers, chills, nausea.  She denies vomiting or diarrhea.  No relief with OTC medication.  No clear exacerbating or precipitating factors.  Past Medical History  Diagnosis Date  . Hyperlipidemia   . Hypertension     Past Surgical History  Procedure Date  . Abdominal hysterectomy     History reviewed. No pertinent family history.  History  Substance Use Topics  . Smoking status: Current Some Day Smoker    Types: Cigarettes  . Smokeless tobacco: Never Used  . Alcohol Use: Yes    OB History    Grav Para Term Preterm Abortions TAB SAB Ect Mult Living                  Review of Systems  Constitutional:       Per HPI, otherwise negative  HENT:       Per HPI, otherwise negative  Eyes: Negative.   Respiratory:       Per HPI, otherwise negative  Cardiovascular:       Per HPI, otherwise negative  Gastrointestinal: Negative for vomiting.  Genitourinary: Negative.   Musculoskeletal:       Per HPI, otherwise negative  Skin: Negative.   Neurological: Positive for weakness. Negative for syncope.    Allergies  Morphine and Morphine and related  Home Medications   Current Outpatient Rx  Name  Route  Sig  Dispense  Refill  . AMLODIPINE BESYLATE 10 MG PO TABS   Oral   Take 1 tablet (10 mg total) by mouth daily.   90 tablet   1   . ASPIRIN 81 MG PO TBEC   Oral   Take 81 mg by mouth daily.           Marland Kitchen HYDROCHLOROTHIAZIDE 25 MG PO TABS   Oral   Take 1 tablet (25 mg total) by  mouth daily.   90 tablet   3   . PANTOPRAZOLE SODIUM 40 MG PO TBEC   Oral   Take 1 tablet (40 mg total) by mouth daily.   90 tablet   1   . POTASSIUM CHLORIDE CRYS ER 20 MEQ PO TBCR   Oral   Take 20 mEq by mouth daily.             BP 130/94  Pulse 96  Temp 98.6 F (37 C) (Oral)  Resp 16  SpO2 97%  Physical Exam  Nursing note and vitals reviewed. Constitutional: She is oriented to person, place, and time. She appears well-developed and well-nourished. No distress.  HENT:  Head: Normocephalic and atraumatic.  Eyes: Conjunctivae normal and EOM are normal.  Cardiovascular: Regular rhythm.  Tachycardia present.   Pulmonary/Chest: Effort normal and breath sounds normal. No stridor. No respiratory distress.  Abdominal: She exhibits no distension.  Musculoskeletal: She exhibits no edema.  Neurological: She is alert and oriented to person, place, and time. No cranial nerve deficit.  Skin: Skin is warm and dry.  Psychiatric: She has a normal mood and affect.  ED Course  Procedures (including critical care time)  Labs Reviewed  COMPREHENSIVE METABOLIC PANEL - Abnormal; Notable for the following:    Sodium 134 (*)     Potassium 3.0 (*)     Total Bilirubin 0.2 (*)     All other components within normal limits  URINALYSIS, ROUTINE W REFLEX MICROSCOPIC - Abnormal; Notable for the following:    APPearance CLOUDY (*)     Ketones, ur TRACE (*)     All other components within normal limits  CBC WITH DIFFERENTIAL   No results found.   No diagnosis found.  Pulse ox 99% room air normal  6:36 PM Patient informed of results.  She smiles, and appears slightly better. MDM  This is a pleasant female presents with generalized complaints, pain, and on exam is in no distress with unremarkable vital signs site for tachycardia.  Given this, her description of pain, headache, weakness, there suspicion of viral syndrome.  The patient's evaluation is notable for demonstration of  hypokalemia, which was repleted here.  Following provision of this, IV fluids, she felt substantially better.  She was discharged in stable condition.  Gerhard Munch, MD 03/04/12 1949

## 2012-03-04 NOTE — ED Notes (Signed)
Pt c/o generalized weakness, back pain, chest pain, and HA. Reports symptoms started Tuesday.

## 2012-03-04 NOTE — ED Notes (Signed)
Pt observed ambulating to the restroom with no problem.

## 2012-03-11 ENCOUNTER — Other Ambulatory Visit: Payer: Self-pay | Admitting: *Deleted

## 2012-05-12 ENCOUNTER — Ambulatory Visit: Payer: Self-pay | Admitting: Cardiology

## 2012-05-17 ENCOUNTER — Encounter: Payer: Self-pay | Admitting: Physician Assistant

## 2012-05-17 ENCOUNTER — Ambulatory Visit (INDEPENDENT_AMBULATORY_CARE_PROVIDER_SITE_OTHER): Payer: Self-pay | Admitting: Physician Assistant

## 2012-05-17 VITALS — BP 152/94 | HR 82 | Ht 62.0 in | Wt 146.2 lb

## 2012-05-17 DIAGNOSIS — F17200 Nicotine dependence, unspecified, uncomplicated: Secondary | ICD-10-CM

## 2012-05-17 DIAGNOSIS — Z720 Tobacco use: Secondary | ICD-10-CM

## 2012-05-17 DIAGNOSIS — I10 Essential (primary) hypertension: Secondary | ICD-10-CM

## 2012-05-17 DIAGNOSIS — E785 Hyperlipidemia, unspecified: Secondary | ICD-10-CM

## 2012-05-17 DIAGNOSIS — K219 Gastro-esophageal reflux disease without esophagitis: Secondary | ICD-10-CM

## 2012-05-17 DIAGNOSIS — R079 Chest pain, unspecified: Secondary | ICD-10-CM

## 2012-05-17 LAB — BASIC METABOLIC PANEL
BUN: 14 mg/dL (ref 6–23)
GFR: 117.86 mL/min (ref >60.00)
Potassium: 3.7 mEq/L (ref 3.5–5.1)

## 2012-05-17 MED ORDER — HYDROCHLOROTHIAZIDE 25 MG PO TABS: 25.0000 mg | ORAL_TABLET | Freq: Every day | ORAL | Status: DC

## 2012-05-17 MED ORDER — POTASSIUM CHLORIDE CRYS ER 20 MEQ PO TBCR: 20.0000 meq | EXTENDED_RELEASE_TABLET | Freq: Every day | ORAL | Status: DC

## 2012-05-17 NOTE — Patient Instructions (Addendum)
YOU HAVE BEEN GIVEN A PRESCRIPTION FOR HCTZ, AND POTASSIUM   LAB TODAY BMET  REPEAT LAB IN 1 WEEK BMET, WITH FASTING LIPID AND LIVER PANEL; THIS CAN BE DONE THE THE SAME DAY YOU COME IN FOR A BLOOD PRESSURE CHECK WITH THE NURSE  PLEASE SCHEDULE A NURSE VISIT FOR 1 WEEK SAME DAY AS LAB WORK  You have been referred to GASTROENTEROLOGY DX GERD, CHEST PAIN, DYSPHAGIA  You have been referred to [facility] PRIMARY CARE (DR. Felicity Coyer ?) DX HTN, GERD

## 2012-05-17 NOTE — Progress Notes (Signed)
[address], [address] [place], [place]  [address] Phone: [phone], Fax:  [phone]  Date:  05/17/2012   ID:  WILLIAM LASKE, DOB 01-May-1955, MRN [id number]  PCP:  No primary provider on file.  Primary Cardiologist:  Dr. Valera Castle     History of Present Illness: Evelyn Mcdaniel is a 57 y.o. female who returns for follow up.  She has a hx of HTN, HL. Echo 6/09: EF 65-75%, mild to moderate LVH.  Myoview 6/09: Small prior apical infarct versus apical thinning, no ischemia, EF 67%. Last seen by Dr. Daleen Squibb 9/12. Plan was to see her back as needed.  She was seen in the ED in 02/2012 with multiple non-specific complaints including chest pain.  She was dx with a viral syndrome.  Notes she ran out of HCTZ couple mos ago.  She has noted a dull headache over the last couple mos as well.  It is located at bilateral temples.  No scotoma.  Not truly throbbing.  She denies significant changes in breathing.  She is NYHA class II-IIb.  No orthopnea, PND, LE edema.  No syncope.  She continues to have chest pain.  These are brief and are located in anterior chest.  No associated dyspnea, nausea, diaphoresis, arm or jaw pain.  They are not clearly exertional.   Labs (11/12):  LDL 146.6 Labs (12/13):  K 3, creatinine 0.61, ALT 12, Hgb 13.5   Wt Readings from Last 3 Encounters:  05/17/12 146 lb 3.2 oz (66.316 kg)  12/13/10 142 lb (64.411 kg)  11/30/09 140 lb 12.8 oz (63.866 kg)     Past Medical History  Diagnosis Date  . Hyperlipidemia   . Hypertension     Current Outpatient Prescriptions  Medication Sig Dispense Refill  . amLODipine (NORVASC) 10 MG tablet Take 1 tablet (10 mg total) by mouth daily.  90 tablet  1  . aspirin 81 MG EC tablet Take 81 mg by mouth daily.        Marland Kitchen atorvastatin (LIPITOR) 20 MG tablet Take 20 mg by mouth daily.      . pantoprazole (PROTONIX) 40 MG tablet Take 1 tablet (40 mg total) by mouth daily.  90 tablet  1  . hydrochlorothiazide (HYDRODIURIL) 25  MG tablet Take 1 tablet (25 mg total) by mouth daily.  90 tablet  3  . potassium chloride SA (K-DUR,KLOR-CON) 20 MEQ tablet Take 20 mEq by mouth daily.         No current facility-administered medications for this visit.    Allergies:    Allergies  Allergen Reactions  . Morphine     REACTION: hives  . Morphine And Related     Social History:  The patient  reports that she has been smoking Cigarettes.  She has been smoking about 0.00 packs per day. She has never used smokeless tobacco. She reports that  drinks alcohol. She reports that she does not use illicit drugs.   ROS:  Please see the history of present illness.   Notes dysphagia.  No odynophagia.  She has frequent dyspepsia.  No melena or hematochezia.  No unexplained weight loss.  No fevers.  She has a chronic cough.  No purulent sputum.   All other systems reviewed and negative.   PHYSICAL EXAM: VS:  BP 152/94  Pulse 82  Ht 5\' 2"  (1.575 m)  Wt 146 lb 3.2 oz (66.316 kg)  BMI 26.73 kg/m2 Well nourished, well developed, in no acute distress  HEENT: normal Neck: no JVD Cardiac:  normal S1, S2; RRR; no murmur Lungs:  clear to auscultation bilaterally, no wheezing, rhonchi or rales Abd: soft, nontender, no hepatomegaly Ext: no edema Skin: warm and dry Neuro:  CNs 2-12 intact, no focal abnormalities noted  EKG:  NSR, HR 83, LAD, PRWP, no change from prior tracing.     ASSESSMENT AND PLAN:  1. Chest Pain:  Atypical.  Suspect all related to GERD.  She has risk factors.  Will arrange plain ETT to rule out ischemia as a cause. 2. GERD:  She has a lot of indigestion and dysphagia.  She takes PPI twice daily.  Will refer to GI. 3. Hypertension:  Suspect uncontrolled BP cause of headaches.  Restart HCTZ and K+.  Check BMET today and repeat in 1 week.  Check BP at time of labs. 4. Hyperlipidemia:  Check fasting lipids and LFTs next week. 5. Tobacco Abuse:  We discussed Chantix vs nicotine patches.  She only smokes 4-5 cigs per day.   I recommend she try setting a quit date and nicotine patches.   6. Disposition:  Will arrange referral to PCP.  She can follow up with me at the time of her ETT.   Signed, Tereso Newcomer, PA-C  2:59 PM 05/17/2012

## 2012-05-18 ENCOUNTER — Telehealth: Payer: Self-pay | Admitting: *Deleted

## 2012-05-18 NOTE — Telephone Encounter (Addendum)
Pt rtn call re results, pls call [phone]

## 2012-05-18 NOTE — Telephone Encounter (Signed)
LVM with pt's husband who said he will have ptcb for her results.

## 2012-05-18 NOTE — Telephone Encounter (Signed)
Message copied by Tarri Fuller on Tue May 18, 2012 11:35 AM ------      Message from: [place], [place] T      Created: Mon May 17, 2012  5:33 PM       Potassium and kidney function look good.      Continue with current treatment plan.      Tereso Newcomer, PA-C  4:49 PM 02/12/2012 ------

## 2012-05-19 ENCOUNTER — Telehealth: Payer: Self-pay | Admitting: *Deleted

## 2012-05-19 NOTE — Telephone Encounter (Signed)
Message copied by Tarri Fuller on Wed May 19, 2012  9:26 AM ------      Message from: [place], [place] T      Created: Mon May 17, 2012  5:33 PM       Potassium and kidney function look good.      Continue with current treatment plan.      Tereso Newcomer, PA-C  4:49 PM 02/12/2012 ------

## 2012-05-19 NOTE — Telephone Encounter (Signed)
lmom labs ok, no changes to be made 

## 2012-05-24 ENCOUNTER — Ambulatory Visit (INDEPENDENT_AMBULATORY_CARE_PROVIDER_SITE_OTHER): Payer: Self-pay | Admitting: *Deleted

## 2012-05-24 VITALS — BP 148/90 | HR 87 | Ht 62.0 in | Wt 141.8 lb

## 2012-05-24 DIAGNOSIS — I10 Essential (primary) hypertension: Secondary | ICD-10-CM

## 2012-05-24 NOTE — Progress Notes (Signed)
Patient came in today for a BP check.  She does not take her medications until 1pm daily.  So she has not had any medications yet today.  She is going to come back in 1-2 weeks for a GXT with Lilian Coma and have her BP checked at that time

## 2012-05-24 NOTE — Patient Instructions (Addendum)
Discussed with Lilian Coma.  Her GXT was never scheduled.  Will scheduled and have patient come back for that in 1-2 weeks.  When she comes she will take her medications with the hope that her BP will be improved

## 2012-06-03 ENCOUNTER — Other Ambulatory Visit: Payer: Self-pay

## 2012-06-03 ENCOUNTER — Ambulatory Visit (INDEPENDENT_AMBULATORY_CARE_PROVIDER_SITE_OTHER): Payer: Self-pay | Admitting: Physician Assistant

## 2012-06-03 DIAGNOSIS — I10 Essential (primary) hypertension: Secondary | ICD-10-CM

## 2012-06-03 DIAGNOSIS — R079 Chest pain, unspecified: Secondary | ICD-10-CM

## 2012-06-03 MED ORDER — METOPROLOL SUCCINATE ER 25 MG PO TB24: 25.0000 mg | ORAL_TABLET | Freq: Every day | ORAL | Status: DC

## 2012-06-03 NOTE — Procedures (Signed)
Exercise Treadmill Test  Pre-Exercise Testing Evaluation Rhythm: normal sinus  Rate: 89     Test  Exercise Tolerance Test Ordering MD: Tereso Newcomer, PA  Interpreting MD: Tereso Newcomer PA-C  Unique Test No: 1  Treadmill:  1  Indication for ETT: chest pain - rule out ischemia  Contraindication to ETT: No   Stress Modality: exercise - treadmill  Cardiac Imaging Performed: non   Protocol: standard Bruce - maximal  Max BP:  168/100  Max MPHR (bpm):  166 85% MPR (bpm):  141  MPHR obtained (bpm):  142 % MPHR obtained:  86%  Reached 85% MPHR (min:sec):  7:02 Total Exercise Time (min-sec):  7:30  Workload in METS:  8.9 Borg Scale: 17  Reason ETT Terminated:  patient's desire to stop    ST Segment Analysis At Rest: normal ST segments - no evidence of significant ST depression With Exercise: non-specific ST changes  Other Information Arrhythmia:  No Angina during ETT:  present (1) Quality of ETT:  diagnostic  ETT Interpretation:  normal - no evidence of ischemia by ST analysis  Comments: Fair exercise tolerance. She did complain of chest pain. Normal BP response to exercise. No ST-T changes to suggest ischemia.   Recommendations: BP elevated prior to starting.  BP still not at target with Norvasc and HCTZ. Add Toprol XL 25 mg QD. She establishes with PCP soon. She can further follow up on BP with PCP. Follow up with Dr. Valera Castle prn. SignedTereso Newcomer, PA-C  11:42 AM 06/03/2012

## 2012-06-03 NOTE — Patient Instructions (Signed)
Start taking Toprol XL (Metoprolol Succinate) daily for BP.  You can take before bedtime each night. See your new Primary Provider this month and continue follow up on BP with him. Follow up with Dr. Valera Castle or Tereso Newcomer, PA-C as needed.

## 2012-06-07 ENCOUNTER — Telehealth: Payer: Self-pay | Admitting: Cardiology

## 2012-06-07 NOTE — Telephone Encounter (Signed)
New problem     Has medication questions.

## 2012-06-07 NOTE — Telephone Encounter (Signed)
Called patient back. She went to pick up Toprol 25mg  XL at the health dept. And was told they have to change to short acting for now because they do not stock the XL. Health dept can get this from the [facility] program but needs med for now. I spoke with Pharm at [facility] Dept named Olegario Messier and she will fill script with Metoprolol 25mg  one half BID for now until she can get Toprolol XL 25mg . Patient aware to pick up medication today.

## 2012-06-08 ENCOUNTER — Ambulatory Visit: Payer: Self-pay | Admitting: Gastroenterology

## 2012-06-14 ENCOUNTER — Encounter: Payer: Self-pay | Admitting: Internal Medicine

## 2012-06-14 ENCOUNTER — Ambulatory Visit (INDEPENDENT_AMBULATORY_CARE_PROVIDER_SITE_OTHER): Payer: Self-pay

## 2012-06-14 ENCOUNTER — Ambulatory Visit (INDEPENDENT_AMBULATORY_CARE_PROVIDER_SITE_OTHER)
Admission: RE | Admit: 2012-06-14 | Discharge: 2012-06-14 | Disposition: A | Discharge status: Home / Self Care | Payer: Self-pay | Source: Ambulatory Visit | Attending: Internal Medicine | Admitting: Internal Medicine

## 2012-06-14 ENCOUNTER — Ambulatory Visit (INDEPENDENT_AMBULATORY_CARE_PROVIDER_SITE_OTHER): Payer: Self-pay | Admitting: Internal Medicine

## 2012-06-14 VITALS — BP 120/82 | HR 87 | Temp 98.2°F | Resp 16 | Ht 62.0 in | Wt 143.0 lb

## 2012-06-14 DIAGNOSIS — Z23 Encounter for immunization: Secondary | ICD-10-CM

## 2012-06-14 DIAGNOSIS — I10 Essential (primary) hypertension: Secondary | ICD-10-CM

## 2012-06-14 DIAGNOSIS — Z1231 Encounter for screening mammogram for malignant neoplasm of breast: Secondary | ICD-10-CM | POA: Insufficient documentation

## 2012-06-14 DIAGNOSIS — R05 Cough: Secondary | ICD-10-CM

## 2012-06-14 DIAGNOSIS — IMO0001 Reserved for inherently not codable concepts without codable children: Secondary | ICD-10-CM | POA: Insufficient documentation

## 2012-06-14 DIAGNOSIS — Z0000 Encounter for general adult medical examination without abnormal findings: Secondary | ICD-10-CM | POA: Insufficient documentation

## 2012-06-14 DIAGNOSIS — E785 Hyperlipidemia, unspecified: Secondary | ICD-10-CM

## 2012-06-14 DIAGNOSIS — J441 Chronic obstructive pulmonary disease with (acute) exacerbation: Secondary | ICD-10-CM

## 2012-06-14 LAB — CBC WITH DIFFERENTIAL/PLATELET
Basophils Relative: 0.6 % (ref 0.0–3.0)
Eosinophils Relative: 1.2 % (ref 0.0–5.0)
Lymphocytes Relative: 46.8 % — ABNORMAL HIGH (ref 12.0–46.0)
Monocytes Relative: 8.2 % (ref 3.0–12.0)
Neutrophils Relative %: 43.2 % (ref 43.0–77.0)
RBC: 4.26 Mil/uL (ref 3.87–5.11)
WBC: 7.5 10*3/uL (ref 4.5–10.5)

## 2012-06-14 LAB — COMPREHENSIVE METABOLIC PANEL
Albumin: 4.2 g/dL (ref 3.5–5.2)
Alkaline Phosphatase: 105 U/L (ref 39–117)
BUN: 13 mg/dL (ref 6–23)
CO2: 28 mEq/L (ref 19–32)
Calcium: 9.2 mg/dL (ref 8.4–10.5)
Chloride: 98 mEq/L (ref 96–112)
GFR: 106.72 mL/min (ref >60.00)
Glucose, Bld: 94 mg/dL (ref 70–99)
Potassium: 3.4 mEq/L — ABNORMAL LOW (ref 3.5–5.1)

## 2012-06-14 LAB — LIPID PANEL: HDL: 42.7 mg/dL (ref >39.00)

## 2012-06-14 LAB — TSH: TSH: 1.66 u[IU]/mL (ref 0.35–5.50)

## 2012-06-14 MED ORDER — FLUTICASONE FUROATE-VILANTEROL 100-25 MCG/INH IN AEPB: 1.0000 | INHALATION_SPRAY | Freq: Every day | RESPIRATORY_TRACT | Status: DC

## 2012-06-14 NOTE — Patient Instructions (Signed)

## 2012-06-14 NOTE — Progress Notes (Signed)
Subjective:    Patient ID: Evelyn Mcdaniel, female    DOB: 08/13/1955, 57 y.o.   MRN: [id number]  Cough This is a chronic problem. The current episode started more than 1 month ago. The problem has been unchanged. The problem occurs every few hours. The cough is productive of sputum. Associated symptoms include shortness of breath and wheezing. Pertinent negatives include no chest pain, chills, ear congestion, ear pain, fever, headaches, heartburn, hemoptysis, myalgias, nasal congestion, postnasal drip, rash, rhinorrhea, sore throat, sweats or weight loss. Risk factors for lung disease include smoking/tobacco exposure. The treatment provided no relief. There is no history of asthma, bronchitis, COPD or pneumonia.      Review of Systems  Constitutional: Negative for fever, chills, weight loss, diaphoresis, activity change, appetite change, fatigue and unexpected weight change.  HENT: Negative.  Negative for ear pain, sore throat, rhinorrhea and postnasal drip.   Eyes: Negative.   Respiratory: Positive for cough, shortness of breath and wheezing. Negative for apnea, hemoptysis, choking, chest tightness and stridor.   Cardiovascular: Negative for chest pain, palpitations and leg swelling.  Gastrointestinal: Negative for heartburn, nausea, vomiting, abdominal pain, diarrhea and constipation.  Endocrine: Negative.   Genitourinary: Negative.   Musculoskeletal: Positive for arthralgias (diffuse hand pain for 10 years). Negative for myalgias, back pain, joint swelling and gait problem.  Skin: Negative for color change, pallor, rash and wound.  Allergic/Immunologic: Negative.   Neurological: Negative for dizziness, speech difficulty, weakness, light-headedness and headaches.  Hematological: Negative for adenopathy. Does not bruise/bleed easily.  Psychiatric/Behavioral: Negative.        Objective:   Physical Exam  Vitals reviewed. Constitutional: She is oriented to person, place, and time. She  appears well-developed and well-nourished. No distress.  HENT:  Head: Normocephalic and atraumatic.  Mouth/Throat: Oropharynx is clear and moist. No oropharyngeal exudate.  Eyes: Conjunctivae are normal. Right eye exhibits no discharge. Left eye exhibits no discharge. No scleral icterus.  Neck: Normal range of motion. Neck supple. No JVD present. No tracheal deviation present. No thyromegaly present.  Cardiovascular: Normal rate, regular rhythm, normal heart sounds and intact distal pulses.  Exam reveals no gallop and no friction rub.   No murmur heard. Pulmonary/Chest: Effort normal. No accessory muscle usage or stridor. Not tachypneic. No respiratory distress. She has no decreased breath sounds. She has wheezes in the right middle field and the left middle field. She has rhonchi in the right middle field and the left middle field. She has no rales. She exhibits no tenderness.  Abdominal: Soft. Bowel sounds are normal. She exhibits no distension and no mass. There is no tenderness. There is no rebound and no guarding.  Musculoskeletal: Normal range of motion. She exhibits no edema and no tenderness.       Right hand: Normal. She exhibits normal range of motion, no tenderness, no bony tenderness, normal two-point discrimination, normal capillary refill, no deformity, no laceration and no swelling. Normal sensation noted. Normal strength noted.       Left hand: Normal. She exhibits normal range of motion, no tenderness, no bony tenderness, normal two-point discrimination, normal capillary refill, no deformity, no laceration and no swelling. Normal sensation noted. Normal strength noted.  Lymphadenopathy:    She has no cervical adenopathy.  Neurological: She is oriented to person, place, and time.  Skin: Skin is warm and dry. No rash noted. She is not diaphoretic. No erythema. No pallor.  Psychiatric: She has a normal mood and affect. Her behavior is normal. Judgment  and thought content normal.           Assessment & Plan:

## 2012-06-15 ENCOUNTER — Encounter: Payer: Self-pay | Admitting: Internal Medicine

## 2012-06-15 NOTE — Assessment & Plan Note (Signed)
I asked her to quit smoking She will start using [facility]

## 2012-06-15 NOTE — Assessment & Plan Note (Signed)
FLP TSH CMP today 

## 2012-06-15 NOTE — Assessment & Plan Note (Signed)
Her BP is well controlled Today I will check her lytes and renal function 

## 2012-06-15 NOTE — Assessment & Plan Note (Signed)
I will check her CXR to see if she has a mass, pna, emphysema

## 2012-06-24 ENCOUNTER — Telehealth: Payer: Self-pay | Admitting: Gastroenterology

## 2012-06-24 NOTE — Telephone Encounter (Signed)
Message copied by Arna Snipe on Thu Jun 24, 2012  4:13 PM ------      Message from: Donata Duff      Created: Tue Jun 08, 2012  9:32 AM       Do not bill  ------

## 2012-07-14 ENCOUNTER — Ambulatory Visit (HOSPITAL_COMMUNITY): Payer: Self-pay | Attending: Internal Medicine

## 2012-08-10 ENCOUNTER — Other Ambulatory Visit: Payer: Self-pay | Admitting: *Deleted

## 2012-08-10 MED ORDER — AMLODIPINE BESYLATE 10 MG PO TABS: 10.0000 mg | ORAL_TABLET | Freq: Every day | ORAL | Status: DC

## 2013-04-25 ENCOUNTER — Encounter (HOSPITAL_COMMUNITY): Payer: Self-pay | Admitting: Emergency Medicine

## 2013-04-25 ENCOUNTER — Emergency Department (HOSPITAL_COMMUNITY)
Admission: EM | Admit: 2013-04-25 | Discharge: 2013-04-25 | Disposition: A | Discharge status: Home / Self Care | Payer: Self-pay | Attending: Emergency Medicine | Admitting: Emergency Medicine

## 2013-04-25 DIAGNOSIS — I10 Essential (primary) hypertension: Secondary | ICD-10-CM | POA: Insufficient documentation

## 2013-04-25 DIAGNOSIS — F17200 Nicotine dependence, unspecified, uncomplicated: Secondary | ICD-10-CM | POA: Insufficient documentation

## 2013-04-25 DIAGNOSIS — R202 Paresthesia of skin: Secondary | ICD-10-CM

## 2013-04-25 DIAGNOSIS — R079 Chest pain, unspecified: Secondary | ICD-10-CM

## 2013-04-25 DIAGNOSIS — Z79899 Other long term (current) drug therapy: Secondary | ICD-10-CM | POA: Insufficient documentation

## 2013-04-25 DIAGNOSIS — R0602 Shortness of breath: Secondary | ICD-10-CM | POA: Insufficient documentation

## 2013-04-25 DIAGNOSIS — R209 Unspecified disturbances of skin sensation: Secondary | ICD-10-CM | POA: Insufficient documentation

## 2013-04-25 DIAGNOSIS — K219 Gastro-esophageal reflux disease without esophagitis: Secondary | ICD-10-CM | POA: Insufficient documentation

## 2013-04-25 DIAGNOSIS — M6281 Muscle weakness (generalized): Secondary | ICD-10-CM | POA: Insufficient documentation

## 2013-04-25 DIAGNOSIS — G5602 Carpal tunnel syndrome, left upper limb: Secondary | ICD-10-CM

## 2013-04-25 DIAGNOSIS — E785 Hyperlipidemia, unspecified: Secondary | ICD-10-CM | POA: Insufficient documentation

## 2013-04-25 DIAGNOSIS — G5600 Carpal tunnel syndrome, unspecified upper limb: Secondary | ICD-10-CM | POA: Insufficient documentation

## 2013-04-25 DIAGNOSIS — M129 Arthropathy, unspecified: Secondary | ICD-10-CM | POA: Insufficient documentation

## 2013-04-25 DIAGNOSIS — Z7982 Long term (current) use of aspirin: Secondary | ICD-10-CM | POA: Insufficient documentation

## 2013-04-25 DIAGNOSIS — R0789 Other chest pain: Secondary | ICD-10-CM | POA: Insufficient documentation

## 2013-04-25 LAB — COMPREHENSIVE METABOLIC PANEL
ALBUMIN: 4.1 g/dL (ref 3.5–5.2)
ALT: 10 U/L (ref 0–35)
AST: 13 U/L (ref 0–37)
Alkaline Phosphatase: 96 U/L (ref 39–117)
BILIRUBIN TOTAL: 0.3 mg/dL (ref 0.3–1.2)
BUN: 9 mg/dL (ref 6–23)
CALCIUM: 9.5 mg/dL (ref 8.4–10.5)
CO2: 27 meq/L (ref 19–32)
CREATININE: 0.71 mg/dL (ref 0.50–1.10)
Chloride: 99 mEq/L (ref 96–112)
GFR calc Af Amer: >90 mL/min (ref >90)
Glucose, Bld: 88 mg/dL (ref 70–99)
Potassium: 3.2 mEq/L — ABNORMAL LOW (ref 3.7–5.3)
Sodium: 141 mEq/L (ref 137–147)
Total Protein: 7.7 g/dL (ref 6.0–8.3)

## 2013-04-25 LAB — CBC WITH DIFFERENTIAL/PLATELET
BASOS ABS: 0.1 10*3/uL (ref 0.0–0.1)
Basophils Relative: 1 % (ref 0–1)
Eosinophils Absolute: 0.1 10*3/uL (ref 0.0–0.7)
Eosinophils Relative: 1 % (ref 0–5)
HEMATOCRIT: 40.1 % (ref 36.0–46.0)
Hemoglobin: 13.9 g/dL (ref 12.0–15.0)
Lymphocytes Relative: 45 % (ref 12–46)
Lymphs Abs: 2.9 10*3/uL (ref 0.7–4.0)
MCH: 31.7 pg (ref 26.0–34.0)
MCHC: 34.7 g/dL (ref 30.0–36.0)
MCV: 91.3 fL (ref 78.0–100.0)
MONO ABS: 0.5 10*3/uL (ref 0.1–1.0)
Monocytes Relative: 7 % (ref 3–12)
NEUTROS ABS: 2.9 10*3/uL (ref 1.7–7.7)
Neutrophils Relative %: 46 % (ref 43–77)
PLATELETS: 191 10*3/uL (ref 150–400)
RBC: 4.39 MIL/uL (ref 3.87–5.11)
RDW: 12.7 % (ref 11.5–15.5)
WBC: 6.5 10*3/uL (ref 4.0–10.5)

## 2013-04-25 LAB — POCT I-STAT TROPONIN I: Troponin i, poc: 0 ng/mL (ref 0.00–0.08)

## 2013-04-25 MED ORDER — POTASSIUM CHLORIDE CRYS ER 20 MEQ PO TBCR
40.0000 meq | EXTENDED_RELEASE_TABLET | Freq: Once | ORAL | Status: AC
  Administered 2013-04-25: 40 meq via ORAL
  Filled 2013-04-25: qty 2

## 2013-04-25 NOTE — Discharge Instructions (Signed)
There is no signs of a heart attack or any other major abnormality at this time. Keep left wrist splinted at all times except for when bathing. No lifting or doing any repetitive motion with left hand. Follow up with primary care doctor for recheck.    Carpal Tunnel Syndrome The carpal tunnel is a narrow area located on the palm side of your wrist. The tunnel is formed by the wrist bones and ligaments. Nerves, blood vessels, and tendons pass through the carpal tunnel. Repeated wrist motion or certain diseases may cause swelling within the tunnel. This swelling pinches the main nerve in the wrist (median nerve) and causes the painful hand and arm condition called carpal tunnel syndrome. CAUSES   Repeated wrist motions.  Wrist injuries.  Certain diseases like arthritis, diabetes, alcoholism, hyperthyroidism, and kidney failure.  Obesity.  Pregnancy. SYMPTOMS   A "pins and needles" feeling in your fingers or hand.  Tingling or numbness in your fingers or hand.  An aching feeling in your entire arm.  Wrist pain that goes up your arm to your shoulder.  Pain that goes down into your palm or fingers.  A weak feeling in your hands. DIAGNOSIS  Your caregiver will take your history and perform a physical exam. An electromyography test may be needed. This test measures electrical signals sent out by the muscles. The electrical signals are usually slowed by carpal tunnel syndrome. You may also need X-rays. TREATMENT  Carpal tunnel syndrome may clear up by itself. Your caregiver may recommend a wrist splint or medicine such as a nonsteroidal anti-inflammatory medicine. Cortisone injections may help. Sometimes, surgery may be needed to free the pinched nerve.  HOME CARE INSTRUCTIONS   Take all medicine as directed by your caregiver. Only take over-the-counter or prescription medicines for pain, discomfort, or fever as directed by your caregiver.  If you were given a splint to keep your wrist  from bending, wear it as directed. It is important to wear the splint at night. Wear the splint for as long as you have pain or numbness in your hand, arm, or wrist. This may take 1 to 2 months.  Rest your wrist from any activity that may be causing your pain. If your symptoms are work-related, you may need to talk to your employer about changing to a job that does not require using your wrist.  Put ice on your wrist after long periods of wrist activity.  Put ice in a plastic bag.  Place a towel between your skin and the bag.  Leave the ice on for 15-20 minutes, 03-04 times a day.  Keep all follow-up visits as directed by your caregiver. This includes any orthopedic referrals, physical therapy, and rehabilitation. Any delay in getting necessary care could result in a delay or failure of your condition to heal. SEEK IMMEDIATE MEDICAL CARE IF:   You have new, unexplained symptoms.  Your symptoms get worse and are not helped or controlled with medicines. MAKE SURE YOU:   Understand these instructions.  Will watch your condition.  Will get help right away if you are not doing well or get worse. Document Released: 03/14/2000 Document Revised: 06/09/2011 Document Reviewed: 01/31/2011 [facility] Patient Information 2014 [place], [place].

## 2013-04-25 NOTE — ED Provider Notes (Signed)
CSN: [id number]     Arrival date & time 04/25/13  1027 History   First MD Initiated Contact with Patient 04/25/13 1133     Chief Complaint  Patient presents with  . Shortness of Breath  . Chest Pain   (Consider location/radiation/quality/duration/timing/severity/associated sxs/prior Treatment) HPI Evelyn Mcdaniel is a 58 y.o. female who presents to emergency department complaining of left arm numbness, tingling, cramping. Patient reports tightness over left upper arm and forearm, and states she has intermittent left fingers tingling and weakness. She states this comes and goes. She states her last several minutes at a time. She states it happens multiple times a day. Has been going on for weeks. Has not seen her doctor for this. She states when her symptoms begin, starts with tingling in her thumb and spreads to the first 3 fingers and then to the rest of the fingers. Patient states that her first 3 fingers become weak and she is unable to grip anything with them. She also reports intermittent chest pain, shortness of breath, rapid heart rate but states it's been going on for months, she has seen her doctor for it, and she was told it could be acid reflux. She states her chest pain has not changed. She states her main concern right now is her left arm tingling and pain.   Past Medical History  Diagnosis Date  . Hyperlipidemia   . Hypertension   . GERD (gastroesophageal reflux disease)   . Arthritis   . PPD positive 2001  . Allergy    Past Surgical History  Procedure Laterality Date  . Abdominal hysterectomy     Family History  Problem Relation Age of Onset  . Alcohol abuse Father   . Depression Father   . Hypertension Mother   . Arthritis Mother   . Cancer Neg Hx   . Diabetes Neg Hx   . Early death Neg Hx   . Heart disease Neg Hx   . Hyperlipidemia Neg Hx   . Kidney disease Neg Hx   . Stroke Neg Hx    History  Substance Use Topics  . Smoking status: Current Some Day Smoker --  1.00 packs/day for 25 years    Types: Cigarettes  . Smokeless tobacco: Never Used  . Alcohol Use: No   OB History   Grav Para Term Preterm Abortions TAB SAB Ect Mult Living                 Review of Systems  Constitutional: Negative for fever and chills.  Respiratory: Positive for chest tightness and shortness of breath. Negative for cough.   Cardiovascular: Positive for chest pain. Negative for palpitations and leg swelling.  Gastrointestinal: Negative for nausea, vomiting, abdominal pain and diarrhea.  Genitourinary: Negative for dysuria, flank pain, vaginal bleeding, vaginal discharge, vaginal pain and pelvic pain.  Musculoskeletal: Negative for arthralgias, myalgias, neck pain and neck stiffness.  Skin: Negative for rash.  Neurological: Positive for weakness and numbness. Negative for dizziness and headaches.  All other systems reviewed and are negative.    Allergies  Morphine and Morphine and related  Home Medications   Current Outpatient Rx  Name  Route  Sig  Dispense  Refill  . amLODipine (NORVASC) 10 MG tablet   Oral   Take 1 tablet (10 mg total) by mouth daily.   90 tablet   1   . aspirin 81 MG EC tablet   Oral   Take 81 mg by mouth daily.           Marland Kitchen  atorvastatin (LIPITOR) 20 MG tablet   Oral   Take 20 mg by mouth daily.         . cholecalciferol (VITAMIN D) 400 UNITS TABS   Oral   Take 400 Units by mouth daily.         . fish oil-omega-3 fatty acids 1000 MG capsule   Oral   Take 1 g by mouth daily.         . Fluticasone Furoate-Vilanterol (BREO ELLIPTA) 100-25 MCG/INH AEPB   Inhalation   Inhale 1 Act into the lungs daily.   178 each   0   . hydrochlorothiazide (HYDRODIURIL) 25 MG tablet   Oral   Take 1 tablet (25 mg total) by mouth daily.   90 tablet   3   . metoprolol succinate (TOPROL-XL) 25 MG 24 hr tablet   Oral   Take 12.5 mg by mouth 2 (two) times daily.         . pantoprazole (PROTONIX) 40 MG tablet   Oral   Take 1  tablet (40 mg total) by mouth daily.   90 tablet   1   . potassium chloride SA (K-DUR,KLOR-CON) 20 MEQ tablet   Oral   Take 1 tablet (20 mEq total) by mouth daily.   90 tablet   3    BP 117/77  Pulse 102  Temp(Src) 97.8 F (36.6 C) (Oral)  Resp 18  SpO2 98% Physical Exam  Nursing note and vitals reviewed. Constitutional: She is oriented to person, place, and time. She appears well-developed and well-nourished. No distress.  HENT:  Head: Normocephalic.  Eyes: Conjunctivae are normal.  Neck: Neck supple.  Cardiovascular: Normal rate, regular rhythm and normal heart sounds.   Pulmonary/Chest: Effort normal and breath sounds normal. No respiratory distress. She has no wheezes. She has no rales.  Abdominal: Soft. Bowel sounds are normal. She exhibits no distension. There is no tenderness. There is no rebound.  Musculoskeletal: She exhibits no edema.  Full ROM of the neck. No midline or perivertebral tenderness. Full rom of shoulder, left elbow, wrist, all fingers.   Neurological: She is alert and oriented to person, place, and time. No cranial nerve deficit. Coordination normal.  Tingling and "different" sensation in the palmar surface of fingers 1, 2, and 3. No strength deficit currently on exam. Pt able to make thumbs up, touch thumb to other fingers, spread all fingers, make a fist. No wrist weakness with flexion or extension against resistance. LE strength intact  Skin: Skin is warm and dry.  Psychiatric: She has a normal mood and affect. Her behavior is normal.    ED Course  Procedures (including critical care time) Labs Review Labs Reviewed  COMPREHENSIVE METABOLIC PANEL - Abnormal; Notable for the following:    Potassium 3.2 (*)    All other components within normal limits  CBC WITH DIFFERENTIAL  POCT I-STAT TROPONIN I   Imaging Review No results found.  EKG Interpretation   None       MDM   1. Paresthesia   2. Carpal tunnel syndrome of left wrist   3.  Chest pain     Pt with chest pain, which has been there for "months" and is not any different. It is atypical. Pt has had work up for this pain including an echo and ETT in the last few months by [facility]. Pt's main complaint is tingling and weakness in left hand that has been there for weeks. Will get labs, Trop, C  Discussed pt with neurology, Dr. Leonel Ramsay who based on her history and exam findings believes this may be carpal tunnel. Unlikely TIA or stroke given symptoms come and go throughout the day, and has been gone for several weeks. i went back and spoke with pt, she does admit waking up in the middle of the night with these symptoms, also positive tinel sign. Will splint with velcro splint. Follow up with PCP. pts's labs, trop, all negative. Pt reassured. Advised to return if worsening.   Filed Vitals:   04/25/13 1300 04/25/13 1316 04/25/13 1330 04/25/13 1357  BP: 132/88 132/88 116/95 116/95  Pulse: 92 96 94 94  Temp:      TempSrc:      Resp: 19 20 17 20   SpO2: 98% 98% 97% 100%      Jelesa Mangini A Camella Seim, PA-C 04/25/13 1719

## 2013-04-25 NOTE — ED Notes (Signed)
Pt is here with intermittent episodes of shortness of breath, chest pain and left arm tingling, rapid heart rate, and pt reports left arm locking up.

## 2013-04-26 NOTE — ED Provider Notes (Signed)
Medical screening examination/treatment/procedure(s) were performed by non-physician practitioner and as supervising physician I was immediately available for consultation/collaboration.  EKG Interpretation   None        Jasper Riling. Alvino Chapel, MD 04/26/13 1616

## 2013-06-07 ENCOUNTER — Other Ambulatory Visit: Payer: Self-pay

## 2013-06-07 MED ORDER — HYDROCHLOROTHIAZIDE 25 MG PO TABS: 25.0000 mg | ORAL_TABLET | Freq: Every day | ORAL | Status: DC

## 2014-02-08 LAB — HM DIABETES EYE EXAM

## 2014-04-10 ENCOUNTER — Ambulatory Visit (INDEPENDENT_AMBULATORY_CARE_PROVIDER_SITE_OTHER): Payer: Medicaid Other

## 2014-04-10 ENCOUNTER — Other Ambulatory Visit (HOSPITAL_COMMUNITY)
Admission: RE | Admit: 2014-04-10 | Discharge: 2014-04-10 | Disposition: A | Discharge status: Home / Self Care | Payer: Medicaid Other | Source: Ambulatory Visit | Attending: Internal Medicine | Admitting: Internal Medicine

## 2014-04-10 ENCOUNTER — Encounter: Payer: Self-pay | Admitting: Gastroenterology

## 2014-04-10 ENCOUNTER — Other Ambulatory Visit: Payer: Medicaid Other

## 2014-04-10 ENCOUNTER — Encounter: Payer: Self-pay | Admitting: Internal Medicine

## 2014-04-10 ENCOUNTER — Ambulatory Visit (INDEPENDENT_AMBULATORY_CARE_PROVIDER_SITE_OTHER): Payer: Medicaid Other | Admitting: Internal Medicine

## 2014-04-10 ENCOUNTER — Other Ambulatory Visit (INDEPENDENT_AMBULATORY_CARE_PROVIDER_SITE_OTHER): Payer: Medicaid Other

## 2014-04-10 VITALS — BP 132/88 | HR 60 | Temp 98.1°F | Resp 16 | Ht 62.0 in | Wt 138.0 lb

## 2014-04-10 DIAGNOSIS — Z1231 Encounter for screening mammogram for malignant neoplasm of breast: Secondary | ICD-10-CM

## 2014-04-10 DIAGNOSIS — Z01419 Encounter for gynecological examination (general) (routine) without abnormal findings: Secondary | ICD-10-CM | POA: Diagnosis not present

## 2014-04-10 DIAGNOSIS — Z0000 Encounter for general adult medical examination without abnormal findings: Secondary | ICD-10-CM | POA: Diagnosis not present

## 2014-04-10 DIAGNOSIS — Z124 Encounter for screening for malignant neoplasm of cervix: Secondary | ICD-10-CM | POA: Insufficient documentation

## 2014-04-10 DIAGNOSIS — H00022 Hordeolum internum right lower eyelid: Secondary | ICD-10-CM | POA: Insufficient documentation

## 2014-04-10 DIAGNOSIS — I10 Essential (primary) hypertension: Secondary | ICD-10-CM

## 2014-04-10 DIAGNOSIS — E785 Hyperlipidemia, unspecified: Secondary | ICD-10-CM

## 2014-04-10 DIAGNOSIS — J449 Chronic obstructive pulmonary disease, unspecified: Secondary | ICD-10-CM

## 2014-04-10 DIAGNOSIS — IMO0001 Reserved for inherently not codable concepts without codable children: Secondary | ICD-10-CM

## 2014-04-10 DIAGNOSIS — Z1151 Encounter for screening for human papillomavirus (HPV): Secondary | ICD-10-CM | POA: Diagnosis present

## 2014-04-10 DIAGNOSIS — Z23 Encounter for immunization: Secondary | ICD-10-CM

## 2014-04-10 LAB — COMPREHENSIVE METABOLIC PANEL
ALT: 12 U/L (ref 0–35)
AST: 15 U/L (ref 0–37)
Albumin: 4.5 g/dL (ref 3.5–5.2)
Alkaline Phosphatase: 103 U/L (ref 39–117)
BUN: 10 mg/dL (ref 6–23)
CO2: 28 meq/L (ref 19–32)
Calcium: 9.4 mg/dL (ref 8.4–10.5)
Chloride: 104 mEq/L (ref 96–112)
Creatinine, Ser: 0.7 mg/dL (ref 0.4–1.2)
GFR: 111.27 mL/min (ref >60.00)
GLUCOSE: 93 mg/dL (ref 70–99)
POTASSIUM: 3.7 meq/L (ref 3.5–5.1)
Sodium: 140 mEq/L (ref 135–145)
Total Bilirubin: 0.7 mg/dL (ref 0.2–1.2)
Total Protein: 8 g/dL (ref 6.0–8.3)

## 2014-04-10 LAB — CBC WITH DIFFERENTIAL/PLATELET
BASOS PCT: 0.7 % (ref 0.0–3.0)
Basophils Absolute: 0 10*3/uL (ref 0.0–0.1)
EOS ABS: 0.1 10*3/uL (ref 0.0–0.7)
EOS PCT: 1.8 % (ref 0.0–5.0)
HCT: 43.3 % (ref 36.0–46.0)
Hemoglobin: 14.3 g/dL (ref 12.0–15.0)
Lymphocytes Relative: 36 % (ref 12.0–46.0)
Lymphs Abs: 2.3 10*3/uL (ref 0.7–4.0)
MCHC: 33.1 g/dL (ref 30.0–36.0)
MCV: 93 fl (ref 78.0–100.0)
Monocytes Absolute: 0.5 10*3/uL (ref 0.1–1.0)
Monocytes Relative: 8.3 % (ref 3.0–12.0)
NEUTROS ABS: 3.5 10*3/uL (ref 1.4–7.7)
Neutrophils Relative %: 53.2 % (ref 43.0–77.0)
Platelets: 189 10*3/uL (ref 150.0–400.0)
RBC: 4.65 Mil/uL (ref 3.87–5.11)
RDW: 12.9 % (ref 11.5–15.5)
WBC: 6.5 10*3/uL (ref 4.0–10.5)

## 2014-04-10 LAB — LIPID PANEL
CHOLESTEROL: 285 mg/dL — AB (ref 0–200)
HDL: 39.6 mg/dL (ref >39.00)
LDL Cholesterol: 207 mg/dL — ABNORMAL HIGH (ref 0–99)
NonHDL: 245.4
Total CHOL/HDL Ratio: 7
Triglycerides: 191 mg/dL — ABNORMAL HIGH (ref 0.0–149.0)
VLDL: 38.2 mg/dL (ref 0.0–40.0)

## 2014-04-10 LAB — TSH: TSH: 0.93 u[IU]/mL (ref 0.35–4.50)

## 2014-04-10 LAB — HEPATITIS C ANTIBODY: HCV AB: NEGATIVE

## 2014-04-10 MED ORDER — HYDROCHLOROTHIAZIDE 25 MG PO TABS: 25.0000 mg | ORAL_TABLET | Freq: Every day | ORAL | Status: DC

## 2014-04-10 MED ORDER — METOPROLOL SUCCINATE ER 25 MG PO TB24: 12.5000 mg | ORAL_TABLET | Freq: Two times a day (BID) | ORAL | Status: AC

## 2014-04-10 MED ORDER — PANTOPRAZOLE SODIUM 40 MG PO TBEC: 40.0000 mg | DELAYED_RELEASE_TABLET | Freq: Every day | ORAL | Status: AC

## 2014-04-10 MED ORDER — POTASSIUM CHLORIDE CRYS ER 20 MEQ PO TBCR: 20.0000 meq | EXTENDED_RELEASE_TABLET | Freq: Every day | ORAL | Status: AC

## 2014-04-10 MED ORDER — AMLODIPINE BESYLATE 10 MG PO TABS: 10.0000 mg | ORAL_TABLET | Freq: Every day | ORAL | Status: AC

## 2014-04-10 MED ORDER — FLUTICASONE FUROATE-VILANTEROL 100-25 MCG/INH IN AEPB: 1.0000 | INHALATION_SPRAY | Freq: Every day | RESPIRATORY_TRACT | Status: AC

## 2014-04-10 MED ORDER — ATORVASTATIN CALCIUM 20 MG PO TABS: 20.0000 mg | ORAL_TABLET | Freq: Every day | ORAL | Status: DC

## 2014-04-10 NOTE — Progress Notes (Signed)
Subjective:    Patient ID: Evelyn Mcdaniel, female    DOB: [birth year], 57 y.o.   MRN: [id number]  Hypertension This is a chronic problem. The current episode started more than 1 year ago. The problem is unchanged. The problem is controlled. Pertinent negatives include no anxiety, blurred vision, chest pain, headaches, malaise/fatigue, neck pain, orthopnea, palpitations, peripheral edema, PND, shortness of breath or sweats. Risk factors for coronary artery disease include smoking/tobacco exposure. Past treatments include beta blockers, diuretics and calcium channel blockers. The current treatment provides moderate improvement. There are no compliance problems.       Review of Systems  Constitutional: Negative.  Negative for fever, chills, malaise/fatigue, diaphoresis, appetite change and fatigue.  HENT: Negative.   Eyes: Negative.  Negative for blurred vision, photophobia, pain, discharge, redness, itching and visual disturbance.       She has a recurrent stye on her rt lower eyelid, she saw an eye doctor about this 3 months ago and was given an oral antibiotic but she feels like the area is still bothering her with some swelling   Respiratory: Negative.  Negative for cough, choking, chest tightness, shortness of breath and stridor.   Cardiovascular: Negative.  Negative for chest pain, palpitations, orthopnea, leg swelling and PND.  Gastrointestinal: Negative.  Negative for nausea, vomiting, abdominal pain, diarrhea, constipation and blood in stool.  Endocrine: Negative.   Genitourinary: Negative.   Musculoskeletal: Negative.  Negative for arthralgias and neck pain.  Skin: Negative.  Negative for rash.  Allergic/Immunologic: Negative.   Neurological: Negative.  Negative for dizziness, tremors, weakness, numbness and headaches.  Hematological: Negative.  Negative for adenopathy. Does not bruise/bleed easily.  Psychiatric/Behavioral: Negative.        Objective:   Physical Exam    Constitutional: She is oriented to person, place, and time. She appears well-developed and well-nourished. No distress.  HENT:  Head: Normocephalic and atraumatic.  Mouth/Throat: Oropharynx is clear and moist. No oropharyngeal exudate.  Eyes: Conjunctivae and EOM are normal. Pupils are equal, round, and reactive to light. Right eye exhibits no discharge and no exudate. Left eye exhibits no discharge and no exudate. Right conjunctiva is not injected. Left conjunctiva is not injected. No scleral icterus.    Neck: Normal range of motion. Neck supple. No JVD present. No tracheal deviation present. No thyromegaly present.  Cardiovascular: Normal rate, regular rhythm, normal heart sounds and intact distal pulses.  Exam reveals no gallop.   No murmur heard. Pulmonary/Chest: Effort normal and breath sounds normal. No accessory muscle usage or stridor. No respiratory distress. She has no wheezes. She has no rales. She exhibits no tenderness.  Abdominal: Soft. Bowel sounds are normal. She exhibits no distension and no mass. There is no tenderness. There is no rebound and no guarding. Hernia confirmed negative in the right inguinal area and confirmed negative in the left inguinal area.  Genitourinary: Vagina normal. Rectal exam shows internal hemorrhoid. Rectal exam shows no external hemorrhoid, no fissure, no mass, no tenderness and anal tone normal. Guaiac negative stool. No breast swelling, tenderness, discharge or bleeding. Pelvic exam was performed with patient supine. No labial fusion. There is no rash, tenderness, lesion or injury on the right labia. There is no rash, tenderness, lesion or injury on the left labia. Cervix exhibits no discharge. Right adnexum displays no mass, no tenderness and no fullness. Left adnexum displays no mass, no tenderness and no fullness. No erythema, tenderness or bleeding in the vagina. No foreign body around the vagina.  No signs of injury around the vagina. No vaginal  discharge found.  Musculoskeletal: Normal range of motion. She exhibits no edema or tenderness.  Lymphadenopathy:    She has no cervical adenopathy.       Right: No inguinal adenopathy present.       Left: No inguinal adenopathy present.  Neurological: She is oriented to person, place, and time.  Skin: Skin is warm and dry. No rash noted. She is not diaphoretic. No erythema. No pallor.  Psychiatric: She has a normal mood and affect. Her behavior is normal. Judgment and thought content normal.  Vitals reviewed.    Lab Results  Component Value Date   WBC 6.5 04/25/2013   HGB 13.9 04/25/2013   HCT 40.1 04/25/2013   PLT 191 04/25/2013   GLUCOSE 88 04/25/2013   CHOL 235* 06/14/2012   TRIG 205.0* 06/14/2012   HDL 42.70 06/14/2012   LDLDIRECT 146.2 06/14/2012   LDLCALC * 09/02/2007    156        Total Cholesterol/HDL:CHD Risk Coronary Heart Disease Risk Table                     Men   Women  1/2 Average Risk   3.4   3.3   ALT 10 04/25/2013   AST 13 04/25/2013   NA 141 04/25/2013   K 3.2* 04/25/2013   CL 99 04/25/2013   CREATININE 0.71 04/25/2013   BUN 9 04/25/2013   CO2 27 04/25/2013   TSH 1.66 06/14/2012       Assessment & Plan:

## 2014-04-10 NOTE — Progress Notes (Signed)
Pre visit review using our clinic review tool, if applicable. No additional management support is needed unless otherwise documented below in the visit note. 

## 2014-04-10 NOTE — Patient Instructions (Signed)
Preventive Care for Adults A healthy lifestyle and preventive care can promote health and wellness. Preventive health guidelines for women include the following key practices.  A routine yearly physical is a good way to check with your health care provider about your health and preventive screening. It is a chance to share any concerns and updates on your health and to receive a thorough exam.  Visit your dentist for a routine exam and preventive care every 6 months. Brush your teeth twice a day and floss once a day. Good oral hygiene prevents tooth decay and gum disease.  The frequency of eye exams is based on your age, health, family medical history, use of contact lenses, and other factors. Follow your health care provider's recommendations for frequency of eye exams.  Eat a healthy diet. Foods like vegetables, fruits, whole grains, low-fat dairy products, and lean protein foods contain the nutrients you need without too many calories. Decrease your intake of foods high in solid fats, added sugars, and salt. Eat the right amount of calories for you.Get information about a proper diet from your health care provider, if necessary.  Regular physical exercise is one of the most important things you can do for your health. Most adults should get at least 150 minutes of moderate-intensity exercise (any activity that increases your heart rate and causes you to sweat) each week. In addition, most adults need muscle-strengthening exercises on 2 or more days a week.  Maintain a healthy weight. The body mass index (BMI) is a screening tool to identify possible weight problems. It provides an estimate of body fat based on height and weight. Your health care provider can find your BMI and can help you achieve or maintain a healthy weight.For adults 20 years and older:  A BMI below 18.5 is considered underweight.  A BMI of 18.5 to 24.9 is normal.  A BMI of 25 to 29.9 is considered overweight.  A BMI of  30 and above is considered obese.  Maintain normal blood lipids and cholesterol levels by exercising and minimizing your intake of saturated fat. Eat a balanced diet with plenty of fruit and vegetables. Blood tests for lipids and cholesterol should begin at age 76 and be repeated every 5 years. If your lipid or cholesterol levels are high, you are over 50, or you are at high risk for heart disease, you may need your cholesterol levels checked more frequently.Ongoing high lipid and cholesterol levels should be treated with medicines if diet and exercise are not working.  If you smoke, find out from your health care provider how to quit. If you do not use tobacco, do not start.  Lung cancer screening is recommended for adults aged 22-80 years who are at high risk for developing lung cancer because of a history of smoking. A yearly low-dose CT scan of the lungs is recommended for people who have at least a 30-pack-year history of smoking and are a current smoker or have quit within the past 15 years. A pack year of smoking is smoking an average of 1 pack of cigarettes a day for 1 year (for example: 1 pack a day for 30 years or 2 packs a day for 15 years). Yearly screening should continue until the smoker has stopped smoking for at least 15 years. Yearly screening should be stopped for people who develop a health problem that would prevent them from having lung cancer treatment.  If you are pregnant, do not drink alcohol. If you are breastfeeding,  be very cautious about drinking alcohol. If you are not pregnant and choose to drink alcohol, do not have more than 1 drink per day. One drink is considered to be 12 ounces (355 mL) of beer, 5 ounces (148 mL) of wine, or 1.5 ounces (44 mL) of liquor.  Avoid use of street drugs. Do not share needles with anyone. Ask for help if you need support or instructions about stopping the use of drugs.  High blood pressure causes heart disease and increases the risk of  stroke. Your blood pressure should be checked at least every 1 to 2 years. Ongoing high blood pressure should be treated with medicines if weight loss and exercise do not work.  If you are 75-52 years old, ask your health care provider if you should take aspirin to prevent strokes.  Diabetes screening involves taking a blood sample to check your fasting blood sugar level. This should be done once every 3 years, after age 15, if you are within normal weight and without risk factors for diabetes. Testing should be considered at a younger age or be carried out more frequently if you are overweight and have at least 1 risk factor for diabetes.  Breast cancer screening is essential preventive care for women. You should practice "breast self-awareness." This means understanding the normal appearance and feel of your breasts and may include breast self-examination. Any changes detected, no matter how small, should be reported to a health care provider. Women in their 58s and 30s should have a clinical breast exam (CBE) by a health care provider as part of a regular health exam every 1 to 3 years. After age 16, women should have a CBE every year. Starting at age 53, women should consider having a mammogram (breast X-ray test) every year. Women who have a family history of breast cancer should talk to their health care provider about genetic screening. Women at a high risk of breast cancer should talk to their health care providers about having an MRI and a mammogram every year.  Breast cancer gene (BRCA)-related cancer risk assessment is recommended for women who have family members with BRCA-related cancers. BRCA-related cancers include breast, ovarian, tubal, and peritoneal cancers. Having family members with these cancers may be associated with an increased risk for harmful changes (mutations) in the breast cancer genes BRCA1 and BRCA2. Results of the assessment will determine the need for genetic counseling and  BRCA1 and BRCA2 testing.  Routine pelvic exams to screen for cancer are no longer recommended for nonpregnant women who are considered low risk for cancer of the pelvic organs (ovaries, uterus, and vagina) and who do not have symptoms. Ask your health care provider if a screening pelvic exam is right for you.  If you have had past treatment for cervical cancer or a condition that could lead to cancer, you need Pap tests and screening for cancer for at least 20 years after your treatment. If Pap tests have been discontinued, your risk factors (such as having a new sexual partner) need to be reassessed to determine if screening should be resumed. Some women have medical problems that increase the chance of getting cervical cancer. In these cases, your health care provider may recommend more frequent screening and Pap tests.  The HPV test is an additional test that may be used for cervical cancer screening. The HPV test looks for the virus that can cause the cell changes on the cervix. The cells collected during the Pap test can be  tested for HPV. The HPV test could be used to screen women aged 30 years and older, and should be used in women of any age who have unclear Pap test results. After the age of 30, women should have HPV testing at the same frequency as a Pap test.  Colorectal cancer can be detected and often prevented. Most routine colorectal cancer screening begins at the age of 50 years and continues through age 75 years. However, your health care provider may recommend screening at an earlier age if you have risk factors for colon cancer. On a yearly basis, your health care provider may provide home test kits to check for hidden blood in the stool. Use of a small camera at the end of a tube, to directly examine the colon (sigmoidoscopy or colonoscopy), can detect the earliest forms of colorectal cancer. Talk to your health care provider about this at age 50, when routine screening begins. Direct  exam of the colon should be repeated every 5-10 years through age 75 years, unless early forms of pre-cancerous polyps or small growths are found.  People who are at an increased risk for hepatitis B should be screened for this virus. You are considered at high risk for hepatitis B if:  You were born in a country where hepatitis B occurs often. Talk with your health care provider about which countries are considered high risk.  Your parents were born in a high-risk country and you have not received a shot to protect against hepatitis B (hepatitis B vaccine).  You have HIV or AIDS.  You use needles to inject street drugs.  You live with, or have sex with, someone who has hepatitis B.  You get hemodialysis treatment.  You take certain medicines for conditions like cancer, organ transplantation, and autoimmune conditions.  Hepatitis C blood testing is recommended for all people born from 1945 through 1965 and any individual with known risks for hepatitis C.  Practice safe sex. Use condoms and avoid high-risk sexual practices to reduce the spread of sexually transmitted infections (STIs). STIs include gonorrhea, chlamydia, syphilis, trichomonas, herpes, HPV, and human immunodeficiency virus (HIV). Herpes, HIV, and HPV are viral illnesses that have no cure. They can result in disability, cancer, and death.  You should be screened for sexually transmitted illnesses (STIs) including gonorrhea and chlamydia if:  You are sexually active and are younger than 24 years.  You are older than 24 years and your health care provider tells you that you are at risk for this type of infection.  Your sexual activity has changed since you were last screened and you are at an increased risk for chlamydia or gonorrhea. Ask your health care provider if you are at risk.  If you are at risk of being infected with HIV, it is recommended that you take a prescription medicine daily to prevent HIV infection. This is  called preexposure prophylaxis (PrEP). You are considered at risk if:  You are a heterosexual woman, are sexually active, and are at increased risk for HIV infection.  You take drugs by injection.  You are sexually active with a partner who has HIV.  Talk with your health care provider about whether you are at high risk of being infected with HIV. If you choose to begin PrEP, you should first be tested for HIV. You should then be tested every 3 months for as long as you are taking PrEP.  Osteoporosis is a disease in which the bones lose minerals and strength   with aging. This can result in serious bone fractures or breaks. The risk of osteoporosis can be identified using a bone density scan. Women ages 65 years and over and women at risk for fractures or osteoporosis should discuss screening with their health care providers. Ask your health care provider whether you should take a calcium supplement or vitamin D to reduce the rate of osteoporosis.  Menopause can be associated with physical symptoms and risks. Hormone replacement therapy is available to decrease symptoms and risks. You should talk to your health care provider about whether hormone replacement therapy is right for you.  Use sunscreen. Apply sunscreen liberally and repeatedly throughout the day. You should seek shade when your shadow is shorter than you. Protect yourself by wearing long sleeves, pants, a wide-brimmed hat, and sunglasses year round, whenever you are outdoors.  Once a month, do a whole body skin exam, using a mirror to look at the skin on your back. Tell your health care provider of new moles, moles that have irregular borders, moles that are larger than a pencil eraser, or moles that have changed in shape or color.  Stay current with required vaccines (immunizations).  Influenza vaccine. All adults should be immunized every year.  Tetanus, diphtheria, and acellular pertussis (Td, Tdap) vaccine. Pregnant women should  receive 1 dose of Tdap vaccine during each pregnancy. The dose should be obtained regardless of the length of time since the last dose. Immunization is preferred during the 27th-36th week of gestation. An adult who has not previously received Tdap or who does not know her vaccine status should receive 1 dose of Tdap. This initial dose should be followed by tetanus and diphtheria toxoids (Td) booster doses every 10 years. Adults with an unknown or incomplete history of completing a 3-dose immunization series with Td-containing vaccines should begin or complete a primary immunization series including a Tdap dose. Adults should receive a Td booster every 10 years.  Varicella vaccine. An adult without evidence of immunity to varicella should receive 2 doses or a second dose if she has previously received 1 dose. Pregnant females who do not have evidence of immunity should receive the first dose after pregnancy. This first dose should be obtained before leaving the health care facility. The second dose should be obtained 4-8 weeks after the first dose.  Human papillomavirus (HPV) vaccine. Females aged 13-26 years who have not received the vaccine previously should obtain the 3-dose series. The vaccine is not recommended for use in pregnant females. However, pregnancy testing is not needed before receiving a dose. If a female is found to be pregnant after receiving a dose, no treatment is needed. In that case, the remaining doses should be delayed until after the pregnancy. Immunization is recommended for any person with an immunocompromised condition through the age of 26 years if she did not get any or all doses earlier. During the 3-dose series, the second dose should be obtained 4-8 weeks after the first dose. The third dose should be obtained 24 weeks after the first dose and 16 weeks after the second dose.  Zoster vaccine. One dose is recommended for adults aged 60 years or older unless certain conditions are  present.  Measles, mumps, and rubella (MMR) vaccine. Adults born before 1957 generally are considered immune to measles and mumps. Adults born in 1957 or later should have 1 or more doses of MMR vaccine unless there is a contraindication to the vaccine or there is laboratory evidence of immunity to   each of the three diseases. A routine second dose of MMR vaccine should be obtained at least 28 days after the first dose for students attending postsecondary schools, health care workers, or international travelers. People who received inactivated measles vaccine or an unknown type of measles vaccine during 1963-1967 should receive 2 doses of MMR vaccine. People who received inactivated mumps vaccine or an unknown type of mumps vaccine before 1979 and are at high risk for mumps infection should consider immunization with 2 doses of MMR vaccine. For females of childbearing age, rubella immunity should be determined. If there is no evidence of immunity, females who are not pregnant should be vaccinated. If there is no evidence of immunity, females who are pregnant should delay immunization until after pregnancy. Unvaccinated health care workers born before 1957 who lack laboratory evidence of measles, mumps, or rubella immunity or laboratory confirmation of disease should consider measles and mumps immunization with 2 doses of MMR vaccine or rubella immunization with 1 dose of MMR vaccine.  Pneumococcal 13-valent conjugate (PCV13) vaccine. When indicated, a person who is uncertain of her immunization history and has no record of immunization should receive the PCV13 vaccine. An adult aged 19 years or older who has certain medical conditions and has not been previously immunized should receive 1 dose of PCV13 vaccine. This PCV13 should be followed with a dose of pneumococcal polysaccharide (PPSV23) vaccine. The PPSV23 vaccine dose should be obtained at least 8 weeks after the dose of PCV13 vaccine. An adult aged 19  years or older who has certain medical conditions and previously received 1 or more doses of PPSV23 vaccine should receive 1 dose of PCV13. The PCV13 vaccine dose should be obtained 1 or more years after the last PPSV23 vaccine dose.  Pneumococcal polysaccharide (PPSV23) vaccine. When PCV13 is also indicated, PCV13 should be obtained first. All adults aged 65 years and older should be immunized. An adult younger than age 65 years who has certain medical conditions should be immunized. Any person who resides in a nursing home or long-term care facility should be immunized. An adult smoker should be immunized. People with an immunocompromised condition and certain other conditions should receive both PCV13 and PPSV23 vaccines. People with human immunodeficiency virus (HIV) infection should be immunized as soon as possible after diagnosis. Immunization during chemotherapy or radiation therapy should be avoided. Routine use of PPSV23 vaccine is not recommended for American Indians, [place] Natives, or people younger than 65 years unless there are medical conditions that require PPSV23 vaccine. When indicated, people who have unknown immunization and have no record of immunization should receive PPSV23 vaccine. One-time revaccination 5 years after the first dose of PPSV23 is recommended for people aged 19-64 years who have chronic kidney failure, nephrotic syndrome, asplenia, or immunocompromised conditions. People who received 1-2 doses of PPSV23 before age 65 years should receive another dose of PPSV23 vaccine at age 65 years or later if at least 5 years have passed since the previous dose. Doses of PPSV23 are not needed for people immunized with PPSV23 at or after age 65 years.  Meningococcal vaccine. Adults with asplenia or persistent complement component deficiencies should receive 2 doses of quadrivalent meningococcal conjugate (MenACWY-D) vaccine. The doses should be obtained at least 2 months apart.  Microbiologists working with certain meningococcal bacteria, military recruits, people at risk during an outbreak, and people who travel to or live in countries with a high rate of meningitis should be immunized. A first-year college student up through age   21 years who is living in a residence hall should receive a dose if she did not receive a dose on or after her 16th birthday. Adults who have certain high-risk conditions should receive one or more doses of vaccine.  Hepatitis A vaccine. Adults who wish to be protected from this disease, have certain high-risk conditions, work with hepatitis A-infected animals, work in hepatitis A research labs, or travel to or work in countries with a high rate of hepatitis A should be immunized. Adults who were previously unvaccinated and who anticipate close contact with an international adoptee during the first 60 days after arrival in the [place] from a country with a high rate of hepatitis A should be immunized.  Hepatitis B vaccine. Adults who wish to be protected from this disease, have certain high-risk conditions, may be exposed to blood or other infectious body fluids, are household contacts or sex partners of hepatitis B positive people, are clients or workers in certain care facilities, or travel to or work in countries with a high rate of hepatitis B should be immunized.  Haemophilus influenzae type b (Hib) vaccine. A previously unvaccinated person with asplenia or sickle cell disease or having a scheduled splenectomy should receive 1 dose of Hib vaccine. Regardless of previous immunization, a recipient of a hematopoietic stem cell transplant should receive a 3-dose series 6-12 months after her successful transplant. Hib vaccine is not recommended for adults with HIV infection. Preventive Services / Frequency Ages 64 to 68 years  Blood pressure check.** / Every 1 to 2 years.  Lipid and cholesterol check.** / Every 5 years beginning at age  22.  Clinical breast exam.** / Every 3 years for women in their 88s and 53s.  BRCA-related cancer risk assessment.** / For women who have family members with a BRCA-related cancer (breast, ovarian, tubal, or peritoneal cancers).  Pap test.** / Every 2 years from ages 90 through 51. Every 3 years starting at age 21 through age 56 or 3 with a history of 3 consecutive normal Pap tests.  HPV screening.** / Every 3 years from ages 24 through ages 1 to 46 with a history of 3 consecutive normal Pap tests.  Hepatitis C blood test.** / For any individual with known risks for hepatitis C.  Skin self-exam. / Monthly.  Influenza vaccine. / Every year.  Tetanus, diphtheria, and acellular pertussis (Tdap, Td) vaccine.** / Consult your health care provider. Pregnant women should receive 1 dose of Tdap vaccine during each pregnancy. 1 dose of Td every 10 years.  Varicella vaccine.** / Consult your health care provider. Pregnant females who do not have evidence of immunity should receive the first dose after pregnancy.  HPV vaccine. / 3 doses over 6 months, if 72 and younger. The vaccine is not recommended for use in pregnant females. However, pregnancy testing is not needed before receiving a dose.  Measles, mumps, rubella (MMR) vaccine.** / You need at least 1 dose of MMR if you were born in 1957 or later. You may also need a 2nd dose. For females of childbearing age, rubella immunity should be determined. If there is no evidence of immunity, females who are not pregnant should be vaccinated. If there is no evidence of immunity, females who are pregnant should delay immunization until after pregnancy.  Pneumococcal 13-valent conjugate (PCV13) vaccine.** / Consult your health care provider.  Pneumococcal polysaccharide (PPSV23) vaccine.** / 1 to 2 doses if you smoke cigarettes or if you have certain conditions.  Meningococcal vaccine.** /  1 dose if you are age 19 to 21 years and a first-year college  student living in a residence hall, or have one of several medical conditions, you need to get vaccinated against meningococcal disease. You may also need additional booster doses.  Hepatitis A vaccine.** / Consult your health care provider.  Hepatitis B vaccine.** / Consult your health care provider.  Haemophilus influenzae type b (Hib) vaccine.** / Consult your health care provider. Ages 40 to 64 years  Blood pressure check.** / Every 1 to 2 years.  Lipid and cholesterol check.** / Every 5 years beginning at age 20 years.  Lung cancer screening. / Every year if you are aged 55-80 years and have a 30-pack-year history of smoking and currently smoke or have quit within the past 15 years. Yearly screening is stopped once you have quit smoking for at least 15 years or develop a health problem that would prevent you from having lung cancer treatment.  Clinical breast exam.** / Every year after age 40 years.  BRCA-related cancer risk assessment.** / For women who have family members with a BRCA-related cancer (breast, ovarian, tubal, or peritoneal cancers).  Mammogram.** / Every year beginning at age 40 years and continuing for as long as you are in good health. Consult with your health care provider.  Pap test.** / Every 3 years starting at age 30 years through age 65 or 70 years with a history of 3 consecutive normal Pap tests.  HPV screening.** / Every 3 years from ages 30 years through ages 65 to 70 years with a history of 3 consecutive normal Pap tests.  Fecal occult blood test (FOBT) of stool. / Every year beginning at age 50 years and continuing until age 75 years. You may not need to do this test if you get a colonoscopy every 10 years.  Flexible sigmoidoscopy or colonoscopy.** / Every 5 years for a flexible sigmoidoscopy or every 10 years for a colonoscopy beginning at age 50 years and continuing until age 75 years.  Hepatitis C blood test.** / For all people born from 1945 through  1965 and any individual with known risks for hepatitis C.  Skin self-exam. / Monthly.  Influenza vaccine. / Every year.  Tetanus, diphtheria, and acellular pertussis (Tdap/Td) vaccine.** / Consult your health care provider. Pregnant women should receive 1 dose of Tdap vaccine during each pregnancy. 1 dose of Td every 10 years.  Varicella vaccine.** / Consult your health care provider. Pregnant females who do not have evidence of immunity should receive the first dose after pregnancy.  Zoster vaccine.** / 1 dose for adults aged 60 years or older.  Measles, mumps, rubella (MMR) vaccine.** / You need at least 1 dose of MMR if you were born in 1957 or later. You may also need a 2nd dose. For females of childbearing age, rubella immunity should be determined. If there is no evidence of immunity, females who are not pregnant should be vaccinated. If there is no evidence of immunity, females who are pregnant should delay immunization until after pregnancy.  Pneumococcal 13-valent conjugate (PCV13) vaccine.** / Consult your health care provider.  Pneumococcal polysaccharide (PPSV23) vaccine.** / 1 to 2 doses if you smoke cigarettes or if you have certain conditions.  Meningococcal vaccine.** / Consult your health care provider.  Hepatitis A vaccine.** / Consult your health care provider.  Hepatitis B vaccine.** / Consult your health care provider.  Haemophilus influenzae type b (Hib) vaccine.** / Consult your health care provider. Ages 65   years and over  Blood pressure check.** / Every 1 to 2 years.  Lipid and cholesterol check.** / Every 5 years beginning at age 43 years.  Lung cancer screening. / Every year if you are aged 35-80 years and have a 30-pack-year history of smoking and currently smoke or have quit within the past 15 years. Yearly screening is stopped once you have quit smoking for at least 15 years or develop a health problem that would prevent you from having lung cancer  treatment.  Clinical breast exam.** / Every year after age 46 years.  BRCA-related cancer risk assessment.** / For women who have family members with a BRCA-related cancer (breast, ovarian, tubal, or peritoneal cancers).  Mammogram.** / Every year beginning at age 11 years and continuing for as long as you are in good health. Consult with your health care provider.  Pap test.** / Every 3 years starting at age 16 years through age 46 or 64 years with 3 consecutive normal Pap tests. Testing can be stopped between 65 and 70 years with 3 consecutive normal Pap tests and no abnormal Pap or HPV tests in the past 10 years.  HPV screening.** / Every 3 years from ages 50 years through ages 87 or 38 years with a history of 3 consecutive normal Pap tests. Testing can be stopped between 65 and 70 years with 3 consecutive normal Pap tests and no abnormal Pap or HPV tests in the past 10 years.  Fecal occult blood test (FOBT) of stool. / Every year beginning at age 26 years and continuing until age 76 years. You may not need to do this test if you get a colonoscopy every 10 years.  Flexible sigmoidoscopy or colonoscopy.** / Every 5 years for a flexible sigmoidoscopy or every 10 years for a colonoscopy beginning at age 64 years and continuing until age 58 years.  Hepatitis C blood test.** / For all people born from [birth year] through 1963 and any individual with known risks for hepatitis C.  Osteoporosis screening.** / A one-time screening for women ages 25 years and over and women at risk for fractures or osteoporosis.  Skin self-exam. / Monthly.  Influenza vaccine. / Every year.  Tetanus, diphtheria, and acellular pertussis (Tdap/Td) vaccine.** / 1 dose of Td every 10 years.  Varicella vaccine.** / Consult your health care provider.  Zoster vaccine.** / 1 dose for adults aged 62 years or older.  Pneumococcal 13-valent conjugate (PCV13) vaccine.** / Consult your health care provider.  Pneumococcal  polysaccharide (PPSV23) vaccine.** / 1 dose for all adults aged 45 years and older.  Meningococcal vaccine.** / Consult your health care provider.  Hepatitis A vaccine.** / Consult your health care provider.  Hepatitis B vaccine.** / Consult your health care provider.  Haemophilus influenzae type b (Hib) vaccine.** / Consult your health care provider. ** Family history and personal history of risk and conditions may change your health care provider's recommendations. Document Released: 05/13/2001 Document Revised: 08/01/2013 Document Reviewed: 08/12/2010 [facility] Patient Information 2015 [place], [place]. This information is not intended to replace advice given to you by your health care provider. Make sure you discuss any questions you have with your health care provider. Hypertension Hypertension, commonly called high blood pressure, is when the force of blood pumping through your arteries is too strong. Your arteries are the blood vessels that carry blood from your heart throughout your body. A blood pressure reading consists of a higher number over a lower number, such as 110/72. The higher number (  systolic) is the pressure inside your arteries when your heart pumps. The lower number (diastolic) is the pressure inside your arteries when your heart relaxes. Ideally you want your blood pressure below 120/80. Hypertension forces your heart to work harder to pump blood. Your arteries may become narrow or stiff. Having hypertension puts you at risk for heart disease, stroke, and other problems.  RISK FACTORS Some risk factors for high blood pressure are controllable. Others are not.  Risk factors you cannot control include:   Race. You may be at higher risk if you are African American.  Age. Risk increases with age.  Gender. Men are at higher risk than women before age 35 years. After age 25, women are at higher risk than men. Risk factors you can control include:  Not getting enough exercise  or physical activity.  Being overweight.  Getting too much fat, sugar, calories, or salt in your diet.  Drinking too much alcohol. SIGNS AND SYMPTOMS Hypertension does not usually cause signs or symptoms. Extremely high blood pressure (hypertensive crisis) may cause headache, anxiety, shortness of breath, and nosebleed. DIAGNOSIS  To check if you have hypertension, your health care provider will measure your blood pressure while you are seated, with your arm held at the level of your heart. It should be measured at least twice using the same arm. Certain conditions can cause a difference in blood pressure between your right and left arms. A blood pressure reading that is higher than normal on one occasion does not mean that you need treatment. If one blood pressure reading is high, ask your health care provider about having it checked again. TREATMENT  Treating high blood pressure includes making lifestyle changes and possibly taking medicine. Living a healthy lifestyle can help lower high blood pressure. You may need to change some of your habits. Lifestyle changes may include:  Following the DASH diet. This diet is high in fruits, vegetables, and whole grains. It is low in salt, red meat, and added sugars.  Getting at least 2 hours of brisk physical activity every week.  Losing weight if necessary.  Not smoking.  Limiting alcoholic beverages.  Learning ways to reduce stress. If lifestyle changes are not enough to get your blood pressure under control, your health care provider may prescribe medicine. You may need to take more than one. Work closely with your health care provider to understand the risks and benefits. HOME CARE INSTRUCTIONS  Have your blood pressure rechecked as directed by your health care provider.   Take medicines only as directed by your health care provider. Follow the directions carefully. Blood pressure medicines must be taken as prescribed. The medicine does  not work as well when you skip doses. Skipping doses also puts you at risk for problems.   Do not smoke.   Monitor your blood pressure at home as directed by your health care provider. SEEK MEDICAL CARE IF:   You think you are having a reaction to medicines taken.  You have recurrent headaches or feel dizzy.  You have swelling in your ankles.  You have trouble with your vision. SEEK IMMEDIATE MEDICAL CARE IF:  You develop a severe headache or confusion.  You have unusual weakness, numbness, or feel faint.  You have severe chest or abdominal pain.  You vomit repeatedly.  You have trouble breathing. MAKE SURE YOU:   Understand these instructions.  Will watch your condition.  Will get help right away if you are not doing well or get  worse. Document Released: 03/17/2005 Document Revised: 08/01/2013 Document Reviewed: 01/07/2013 [facility] Patient Information 2015 [place], [place]. This information is not intended to replace advice given to you by your health care provider. Make sure you discuss any questions you have with your health care provider.

## 2014-04-10 NOTE — Assessment & Plan Note (Signed)
She is doing well on breo

## 2014-04-10 NOTE — Assessment & Plan Note (Signed)
This may need to be drained Will refer to ophthalmology

## 2014-04-10 NOTE — Addendum Note (Signed)
Addended by: Lowella Dandy on: 04/10/2014 05:28 PM   Modules accepted: Orders

## 2014-04-10 NOTE — Assessment & Plan Note (Signed)
Exam done Vaccines were reviewed and updated She was referred for mammogram and colonoscopy Labs ordered Pt ed material was given

## 2014-04-10 NOTE — Assessment & Plan Note (Signed)
Will recheck her FLP

## 2014-04-10 NOTE — Assessment & Plan Note (Signed)
Her BP is well controlled on the current regimen Will check her lytes and renal function

## 2014-04-11 ENCOUNTER — Encounter: Payer: Self-pay | Admitting: Internal Medicine

## 2014-04-12 LAB — CYTOLOGY - PAP

## 2014-04-12 LAB — HM PAP SMEAR: HM PAP: NORMAL

## 2014-04-24 ENCOUNTER — Ambulatory Visit
Admission: RE | Admit: 2014-04-24 | Discharge: 2014-04-24 | Disposition: A | Discharge status: Home / Self Care | Payer: Medicaid Other | Source: Ambulatory Visit | Attending: Internal Medicine | Admitting: Internal Medicine

## 2014-04-24 DIAGNOSIS — Z1231 Encounter for screening mammogram for malignant neoplasm of breast: Secondary | ICD-10-CM

## 2014-04-25 LAB — HM MAMMOGRAPHY: HM Mammogram: NORMAL

## 2014-05-22 ENCOUNTER — Other Ambulatory Visit: Payer: Self-pay | Admitting: Internal Medicine

## 2014-05-22 ENCOUNTER — Telehealth: Payer: Self-pay | Admitting: Internal Medicine

## 2014-05-22 ENCOUNTER — Ambulatory Visit (AMBULATORY_SURGERY_CENTER): Payer: Self-pay

## 2014-05-22 VITALS — Ht 62.0 in | Wt 137.4 lb

## 2014-05-22 DIAGNOSIS — Z1211 Encounter for screening for malignant neoplasm of colon: Secondary | ICD-10-CM

## 2014-05-22 MED ORDER — MOVIPREP 100 G PO SOLR: 1.0000 | Freq: Once | ORAL | Status: DC

## 2014-05-22 NOTE — Progress Notes (Signed)
No allergies to eggs or soy No home oxygen No diet/weight loss meds No past problems with anesthesia  No email

## 2014-05-22 NOTE — Telephone Encounter (Signed)
Pt can call the pharmacy and request that the pharmacy send [place] a refill request. It is up to the pt.   erx done by PCP

## 2014-05-22 NOTE — Telephone Encounter (Signed)
Pt called request refill for hydrochlorothiazide (HYDRODIURIL) 25 MG tablet to be send to [facility]. Please check, pt was asking to see if she need to call every time she need refill. Please check

## 2014-05-23 ENCOUNTER — Telehealth: Payer: Self-pay | Admitting: Internal Medicine

## 2014-05-23 NOTE — Telephone Encounter (Signed)
emmi emailed °

## 2014-05-28 ENCOUNTER — Emergency Department (HOSPITAL_COMMUNITY): Payer: Medicaid Other

## 2014-05-28 ENCOUNTER — Emergency Department (HOSPITAL_COMMUNITY)
Admission: EM | Admit: 2014-05-28 | Discharge: 2014-05-28 | Disposition: A | Discharge status: Home / Self Care | Payer: Medicaid Other | Attending: Emergency Medicine | Admitting: Emergency Medicine

## 2014-05-28 ENCOUNTER — Encounter (HOSPITAL_COMMUNITY): Payer: Self-pay

## 2014-05-28 DIAGNOSIS — I10 Essential (primary) hypertension: Secondary | ICD-10-CM | POA: Diagnosis not present

## 2014-05-28 DIAGNOSIS — K219 Gastro-esophageal reflux disease without esophagitis: Secondary | ICD-10-CM | POA: Diagnosis not present

## 2014-05-28 DIAGNOSIS — Z79899 Other long term (current) drug therapy: Secondary | ICD-10-CM | POA: Diagnosis not present

## 2014-05-28 DIAGNOSIS — R091 Pleurisy: Secondary | ICD-10-CM

## 2014-05-28 DIAGNOSIS — Z7951 Long term (current) use of inhaled steroids: Secondary | ICD-10-CM | POA: Diagnosis not present

## 2014-05-28 DIAGNOSIS — J029 Acute pharyngitis, unspecified: Secondary | ICD-10-CM | POA: Insufficient documentation

## 2014-05-28 DIAGNOSIS — M1990 Unspecified osteoarthritis, unspecified site: Secondary | ICD-10-CM | POA: Diagnosis not present

## 2014-05-28 DIAGNOSIS — Z7982 Long term (current) use of aspirin: Secondary | ICD-10-CM | POA: Insufficient documentation

## 2014-05-28 DIAGNOSIS — Z720 Tobacco use: Secondary | ICD-10-CM | POA: Diagnosis not present

## 2014-05-28 DIAGNOSIS — Z8739 Personal history of other diseases of the musculoskeletal system and connective tissue: Secondary | ICD-10-CM | POA: Diagnosis not present

## 2014-05-28 DIAGNOSIS — R0602 Shortness of breath: Secondary | ICD-10-CM | POA: Diagnosis present

## 2014-05-28 LAB — D-DIMER, QUANTITATIVE: D-Dimer, Quant: 0.5 ug/mL-FEU — ABNORMAL HIGH (ref 0.00–0.48)

## 2014-05-28 MED ORDER — IOHEXOL 350 MG/ML SOLN
100.0000 mL | Freq: Once | INTRAVENOUS | Status: AC | PRN
  Administered 2014-05-28: 100 mL via INTRAVENOUS

## 2014-05-28 NOTE — ED Notes (Signed)
Patient transported to X-ray 

## 2014-05-28 NOTE — ED Notes (Signed)
Patient transported to CT 

## 2014-05-28 NOTE — ED Notes (Signed)
MD at bedside. 

## 2014-05-28 NOTE — ED Provider Notes (Signed)
CSN: [id number]     Arrival date & time 05/28/14  0827 History   First MD Initiated Contact with Patient 05/28/14 [id number]     Chief Complaint  Patient presents with  . Shortness of Breath  . Sore Throat     Patient is a 59 y.o. female presenting with cough. The history is provided by the patient.  Cough Cough characteristics:  Productive Sputum characteristics:  Yellow Severity:  Moderate Onset quality:  Gradual Duration:  1 week Timing:  Intermittent Progression:  Worsening Chronicity:  New Relieved by:  Nothing Worsened by:  Nothing tried Associated symptoms: chest pain, chills, shortness of breath and sore throat   Associated symptoms: no fever   Patient reports cough for one week She reports 4 days ago, she started having CP/BP only when she coughs or takes a deep breath No vomiting/diarrhea No LE edema She reports sore throat for past 3 days (no drooling)   She denies known h/o DVT/PE  Past Medical History  Diagnosis Date  . Hyperlipidemia   . Hypertension   . GERD (gastroesophageal reflux disease)   . Arthritis   . PPD positive 2001  . Allergy    Past Surgical History  Procedure Laterality Date  . Abdominal hysterectomy     Family History  Problem Relation Age of Onset  . Alcohol abuse Father   . Depression Father   . Hypertension Mother   . Arthritis Mother   . Cancer Neg Hx   . Diabetes Neg Hx   . Early death Neg Hx   . Heart disease Neg Hx   . Hyperlipidemia Neg Hx   . Kidney disease Neg Hx   . Stroke Neg Hx    History  Substance Use Topics  . Smoking status: Current Some Day Smoker -- 1.00 packs/day for 25 years    Types: Cigarettes  . Smokeless tobacco: Never Used  . Alcohol Use: No   OB History    No data available     Review of Systems  Constitutional: Positive for chills. Negative for fever.  HENT: Positive for sore throat. Negative for drooling.   Respiratory: Positive for cough and shortness of breath.   Cardiovascular: Positive  for chest pain. Negative for leg swelling.  Gastrointestinal: Negative for vomiting and diarrhea.  All other systems reviewed and are negative.     Allergies  Morphine and Morphine and related  Home Medications   Prior to Admission medications   Medication Sig Start Date End Date Taking? Authorizing Provider  amLODipine (NORVASC) 10 MG tablet Take 1 tablet (10 mg total) by mouth daily. 04/10/14  Yes Janith Lima, MD  aspirin 81 MG EC tablet Take 81 mg by mouth daily.     Yes Historical Provider, MD  Aspirin-Salicylamide-Caffeine 536-644-03.4 MG PACK Take 1 packet by mouth every 6 (six) hours as needed (for pain).   Yes Historical Provider, MD  Fluticasone Furoate-Vilanterol (BREO ELLIPTA) 100-25 MCG/INH AEPB Inhale 1 puff into the lungs daily. 04/10/14  Yes Janith Lima, MD  hydrochlorothiazide (HYDRODIURIL) 25 MG tablet TAKE ONE TABLET BY MOUTH DAILY 05/22/14  Yes Janith Lima, MD  ibuprofen (ADVIL,MOTRIN) 200 MG tablet Take 400 mg by mouth every 6 (six) hours as needed (pain).   Yes Historical Provider, MD  metoprolol succinate (TOPROL-XL) 25 MG 24 hr tablet Take 0.5 tablets (12.5 mg total) by mouth 2 (two) times daily. 04/10/14 04/10/15 Yes Janith Lima, MD  pantoprazole (PROTONIX) 40 MG tablet Take 1 tablet (  40 mg total) by mouth daily. 04/10/14  Yes Janith Lima, MD  potassium chloride SA (K-DUR,KLOR-CON) 20 MEQ tablet Take 1 tablet (20 mEq total) by mouth daily. 04/10/14  Yes Janith Lima, MD  MOVIPREP 100 G SOLR Take 1 kit (200 g total) by mouth once. 05/22/14   Milus Banister, MD   BP 128/91 mmHg  Pulse 85  Temp(Src) 98.2 F (36.8 C) (Oral)  Resp 14  SpO2 97% Physical Exam CONSTITUTIONAL: Well developed/well nourished HEAD: Normocephalic/atraumatic EYES: EOMI/PERRL ENMT: Mucous membranes moist, uvula midline, midline erythema, no exudates noted, no stridor NECK: supple no meningeal signs SPINE/BACK:entire spine nontender CV: S1/S2 noted, no murmurs/rubs/gallops  noted LUNGS: Lungs are clear to auscultation bilaterally, no apparent distress Chest - no tenderness noted ABDOMEN: soft, nontender, no rebound or guarding, bowel sounds noted throughout abdomen GU:no cva tenderness NEURO: Pt is awake/alert/appropriate, moves all extremitiesx4.  No facial droop.   EXTREMITIES: pulses normal/equal, full ROM, no LE edema/tenderness SKIN: warm, color normal PSYCH: no abnormalities of mood noted, alert and oriented to situation  ED Course  Procedures  11:29 AM Pt with persistent pleuritic CP with elevated d-dimer Will get CT chest (had normal renal function last month) She declines pain meds 12:41 PM CT chest negative Pt well appearing Appropriate for d/c home I doubt ACS at this time as cause of CP BP 149/98 mmHg  Pulse 89  Temp(Src) 98.2 F (36.8 C) (Oral)  Resp 14  SpO2 100%  Labs Review Labs Reviewed  D-DIMER, QUANTITATIVE - Abnormal; Notable for the following:    D-Dimer, Quant 0.50 (*)    All other components within normal limits    Imaging Review Dg Chest 2 View  05/28/2014   CLINICAL DATA:  58 year old female with chest pain, shortness of breath, back pain, cough and congestion for the past 3 days. Clinical history includes prior smoking history and COPD had.  EXAM: CHEST  2 VIEW  COMPARISON:  Prior chest x-ray 06/14/2012  FINDINGS: Cardiac and mediastinal contours are within normal limits. Trace atherosclerotic calcifications in the tortuous thoracic aorta. Stable mild hyperinflation and central bronchitic change. Stable calcified granuloma in the right lung base. The lungs are otherwise clear. No focal airspace consolidation, pleural effusion, pneumothorax or pulmonary edema. No acute osseous abnormality.  IMPRESSION: Stable chest x-ray without evidence of acute cardiopulmonary disease.   Electronically Signed   By: Jacqulynn Cadet M.D.   On: 05/28/2014 09:58   Ct Angio Chest Pe W/cm &/or Wo Cm  05/28/2014   CLINICAL DATA:  Shortness of  breath, chest tightness, elevated D-dimer level.  EXAM: CT ANGIOGRAPHY CHEST WITH CONTRAST  TECHNIQUE: Multidetector CT imaging of the chest was performed using the standard protocol during bolus administration of intravenous contrast. Multiplanar CT image reconstructions and MIPs were obtained to evaluate the vascular anatomy.  CONTRAST:  137m OMNIPAQUE IOHEXOL 350 MG/ML SOLN  COMPARISON:  Radiographs of same day.  FINDINGS: No pneumothorax or pleural effusion is noted. Calcified granuloma is noted in right lateral costophrenic sulcus. No acute pulmonary disease is noted. There is no evidence of pulmonary embolus. There is no evidence of thoracic aortic dissection. No mediastinal mass or adenopathy is noted. Visualized portion of upper abdomen appears normal. No significant osseous abnormality is noted in the chest.  Review of the MIP images confirms the above findings.  IMPRESSION: No evidence of pulmonary embolus. No acute cardiopulmonary abnormality seen.   Electronically Signed   By: JMarijo Conception M.D.   On:  05/28/2014 12:27     EKG Interpretation   Date/Time:  Sunday May 28 2014 08:42:02 EST Ventricular Rate:  82 PR Interval:  197 QRS Duration: 91 QT Interval:  395 QTC Calculation: 461 R Axis:   -62 Text Interpretation:  Sinus rhythm Consider right atrial enlargement Left  anterior fascicular block Consider right ventricular hypertrophy Consider  anterior infarct No significant change since last tracing Confirmed by  Christy Gentles  MD, Elenore Rota ([id number]) on 05/28/2014 8:52:14 AM      MDM   Final diagnoses:  Pleurisy    Nursing notes including past medical history and social history reviewed and considered in documentation Labs/vital reviewed myself and considered during evaluation xrays/imaging reviewed by myself and considered during evaluation     Sharyon Cable, MD 05/28/14 1242

## 2014-05-28 NOTE — ED Notes (Signed)
Pt c/o chest tightness, upper back pain, cough, and sore throat x 2 days.  Pain score 4/10.  Sts mucous has changed from clear to yellow.  Pt reports that he "cannot take OTC, due to HTN medication."  Hx of smoking.

## 2014-05-28 NOTE — Discharge Instructions (Signed)

## 2014-06-05 ENCOUNTER — Encounter: Payer: Self-pay | Admitting: Gastroenterology

## 2014-06-05 ENCOUNTER — Ambulatory Visit (AMBULATORY_SURGERY_CENTER): Payer: Medicaid Other | Admitting: Gastroenterology

## 2014-06-05 VITALS — BP 135/85 | HR 82 | Temp 96.8°F | Resp 10 | Ht 62.0 in | Wt 138.0 lb

## 2014-06-05 DIAGNOSIS — D122 Benign neoplasm of ascending colon: Secondary | ICD-10-CM

## 2014-06-05 DIAGNOSIS — Z1211 Encounter for screening for malignant neoplasm of colon: Secondary | ICD-10-CM

## 2014-06-05 MED ORDER — SODIUM CHLORIDE 0.9 % IV SOLN
500.0000 mL | INTRAVENOUS | Status: DC
Start: 2014-06-05 — End: 2014-06-05

## 2014-06-05 NOTE — Progress Notes (Signed)
Patient denies any allergies to eggs or soy. 

## 2014-06-05 NOTE — Progress Notes (Signed)
Report to PACU, RN, vss, BBS= Clear.  

## 2014-06-05 NOTE — Patient Instructions (Signed)
Discharge instructions given. Handouts on polyps and hemorrhoids. Resume previous medications. YOU HAD AN ENDOSCOPIC PROCEDURE TODAY AT THE [facility]:   Refer to the procedure report that was given to you for any specific questions about what was found during the examination.  If the procedure report does not answer your questions, please call your gastroenterologist to clarify.  If you requested that your care partner not be given the details of your procedure findings, then the procedure report has been included in a sealed envelope for you to review at your convenience later.  YOU SHOULD EXPECT: Some feelings of bloating in the abdomen. Passage of more gas than usual.  Walking can help get rid of the air that was put into your GI tract during the procedure and reduce the bloating. If you had a lower endoscopy (such as a colonoscopy or flexible sigmoidoscopy) you may notice spotting of blood in your stool or on the toilet paper. If you underwent a bowel prep for your procedure, you may not have a normal bowel movement for a few days.  Please Note:  You might notice some irritation and congestion in your nose or some drainage.  This is from the oxygen used during your procedure.  There is no need for concern and it should clear up in a day or so.  SYMPTOMS TO REPORT IMMEDIATELY:   Following lower endoscopy (colonoscopy or flexible sigmoidoscopy):  Excessive amounts of blood in the stool  Significant tenderness or worsening of abdominal pains  Swelling of the abdomen that is new, acute  Fever of 100F or higher   For urgent or emergent issues, a gastroenterologist can be reached at any hour by calling [phone].   DIET: Your first meal following the procedure should be a small meal and then it is ok to progress to your normal diet. Heavy or fried foods are harder to digest and may make you feel nauseous or bloated.  Likewise, meals heavy in dairy and vegetables can increase  bloating.  Drink plenty of fluids but you should avoid alcoholic beverages for 24 hours.  ACTIVITY:  You should plan to take it easy for the rest of today and you should NOT DRIVE or use heavy machinery until tomorrow (because of the sedation medicines used during the test).    FOLLOW UP: Our staff will call the number listed on your records the next business day following your procedure to check on you and address any questions or concerns that you may have regarding the information given to you following your procedure. If we do not reach you, we will leave a message.  However, if you are feeling well and you are not experiencing any problems, there is no need to return our call.  We will assume that you have returned to your regular daily activities without incident.  If any biopsies were taken you will be contacted by phone or by letter within the next 1-3 weeks.  Please call us at [phone] if you have not heard about the biopsies in 3 weeks.    SIGNATURES/CONFIDENTIALITY: You and/or your care partner have signed paperwork which will be entered into your electronic medical record.  These signatures attest to the fact that that the information above on your After Visit Summary has been reviewed and is understood.  Full responsibility of the confidentiality of this discharge information lies with you and/or your care-partner. 

## 2014-06-05 NOTE — Progress Notes (Signed)
Called to room to assist during endoscopic procedure.  Patient ID and intended procedure confirmed with present staff. Received instructions for my participation in the procedure from the performing physician.  

## 2014-06-05 NOTE — Op Note (Signed)
[name] [place], [address]   COLONOSCOPY PROCEDURE REPORT  PATIENT: Evelyn, Mcdaniel  MR#: [id number] BIRTHDATE: [birth year] , 58  yrs. old GENDER: female ENDOSCOPIST: Milus Banister, MD REFERRED [id number] Evalina Field, M.D. PROCEDURE DATE:  06/05/2014 PROCEDURE:   Colonoscopy, screening and Colonoscopy with snare polypectomy, Colonoscopy with biopsy First Screening Colonoscopy - Avg.  risk and is 50 yrs.  old or older Yes.  Prior Negative Screening - Now for repeat screening. N/A  History of Adenoma - Now for follow-up colonoscopy & has been > or = to 3 yrs.  N/A  Polyps Removed Today? Yes. ASA CLASS:   Class II INDICATIONS:Screening for colonic neoplasia and Average Risk. MEDICATIONS: Monitored anesthesia care and Propofol 200 mg IV Black/Hispanic DESCRIPTION OF PROCEDURE:   After the risks benefits and alternatives of the procedure were thoroughly explained, informed consent was obtained.  The digital rectal exam revealed no abnormalities of the rectum.   The [id number]  endoscope was introduced through the anus and advanced to the cecum, which was identified by both the appendix and ileocecal valve. No adverse events experienced.   The quality of the prep was excellent.  The instrument was then slowly withdrawn as the colon was fully examined.  COLON FINDINGS: Two sessile polyps ranging between 3-60mm in size were found in the ascending colon.  Polypectomies were performed (one with cold snare, one with biopsy).  The resection was complete, the polyp tissue was completely retrieved and sent to histology.   Small internal Grade I hemorrhoids were found.   The examination was otherwise normal.  Retroflexed views revealed no abnormalities. The time to cecum = 2.2 Withdrawal time = 9.8   The scope was withdrawn and the procedure completed. COMPLICATIONS: There were no immediate complications.  ENDOSCOPIC IMPRESSION: 1.   Two sessile  polyps were found and removed 2.   Small internal Grade I hemorrhoids 3.   The examination was otherwise normal  RECOMMENDATIONS: If the polyp(s) removed today are proven to be adenomatous (pre-cancerous) polyps, you will need a repeat colonoscopy in 5 years.  Otherwise you should continue to follow colorectal cancer screening guidelines for "routine risk" patients with colonoscopy in 10 years.  You will receive a letter within 1-2 weeks with the results of your biopsy as well as final recommendations.  Please call my office if you have not received a letter after 3 weeks.  eSigned:  Milus Banister, MD 06/05/2014 10:49 AM

## 2014-06-06 ENCOUNTER — Telehealth: Payer: Self-pay

## 2014-06-06 NOTE — Telephone Encounter (Signed)
  Follow up Call-  Call back number 06/05/2014  Post procedure Call Back phone  # [phone]  Permission to leave phone message Yes     Patient questions:  Do you have a fever, pain , or abdominal swelling? No. Pain Score  0 *  Have you tolerated food without any problems? Yes.    Have you been able to return to your normal activities? Yes.    Do you have any questions about your discharge instructions: Diet   No. Medications  No. Follow up visit  No.  Do you have questions or concerns about your Care? No.  Actions: * If pain score is 4 or above: No action needed, pain <4.

## 2014-06-13 ENCOUNTER — Encounter: Payer: Self-pay | Admitting: Gastroenterology

## 2014-06-27 ENCOUNTER — Ambulatory Visit: Payer: Medicaid Other | Admitting: Internal Medicine

## 2014-12-01 ENCOUNTER — Telehealth: Payer: Self-pay

## 2014-12-01 NOTE — Telephone Encounter (Signed)
Received fax that Hep C Antibody is not supported with the ICD 10 code Z00.00

## 2014-12-06 NOTE — Telephone Encounter (Signed)
Formed fax backed with correct code

## 2016-01-11 IMAGING — CT CT ANGIO CHEST
1 of 3 series · 18 of 33 positions shown · IV contrast (OMNIPAQUE 350)
Comparison: Radiographs of same day.

CLINICAL DATA: Shortness of breath, chest tightness, elevated
D-dimer level.

EXAM:
CT ANGIOGRAPHY CHEST WITH CONTRAST
TECHNIQUE: Multidetector CT imaging of the chest was performed using the
standard protocol during bolus administration of intravenous
contrast. Multiplanar CT image reconstructions and MIPs were
obtained to evaluate the vascular anatomy.
CONTRAST:  100mL OMNIPAQUE IOHEXOL 350 MG/ML SOLN

[Series 8: thins for pacs · axial · 0.74mm/px · z∈[-225,-31]mm · 18 of 222 slices shown]
[im 14/222  lung]
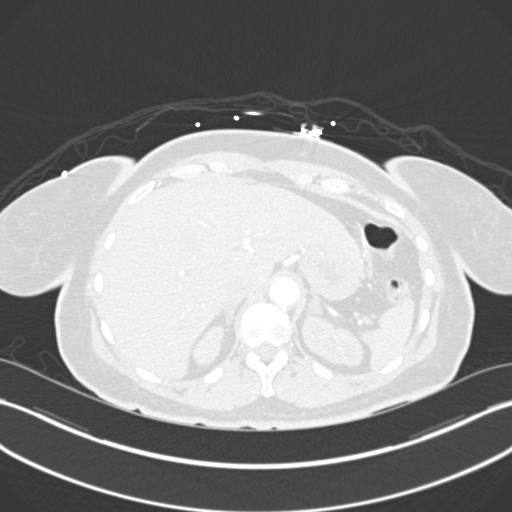
[im 28/222  mediastinal]
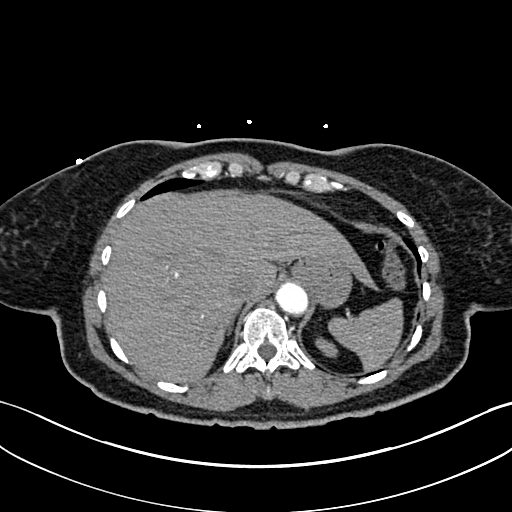
[im 42/222  lung]
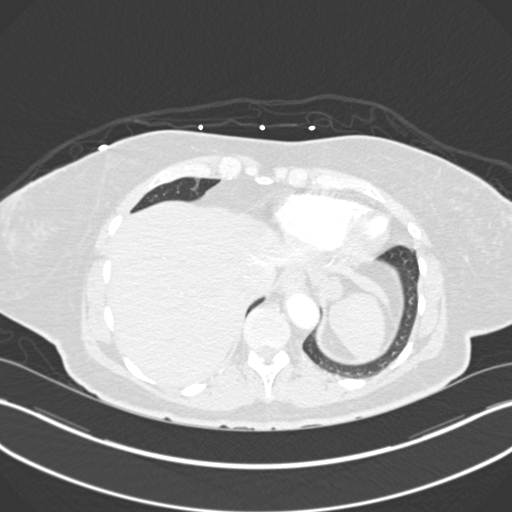
[im 56/222  mediastinal]
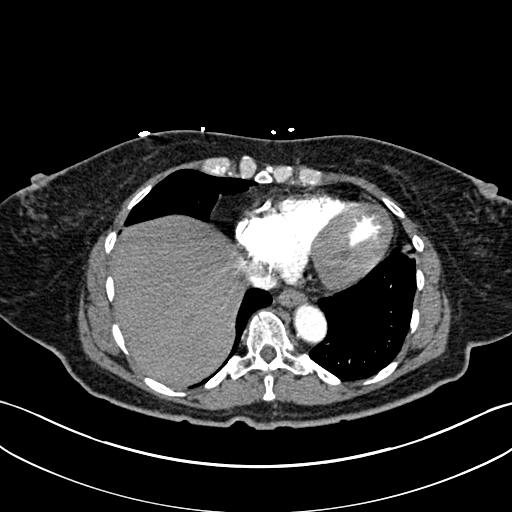
[im 70/222  lung]
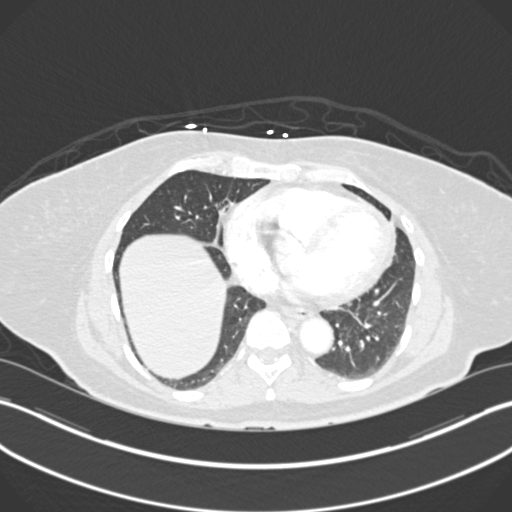
[im 74/222  mediastinal]
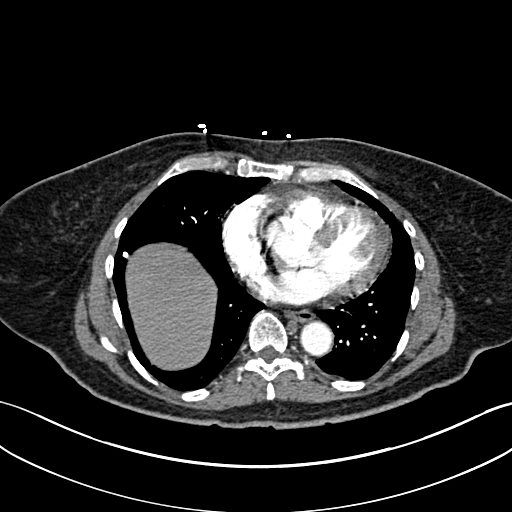
[im 83/222  lung]
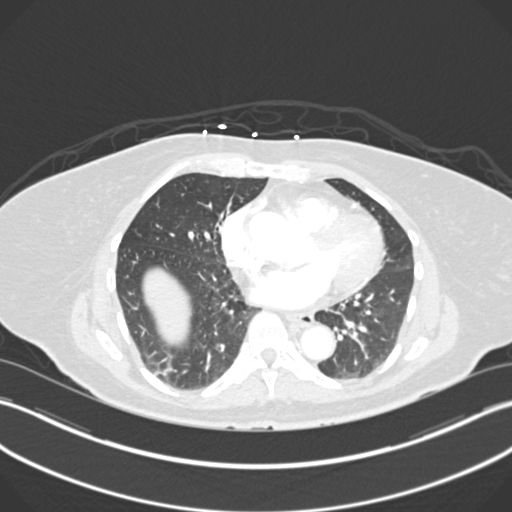
[im 97/222  mediastinal]
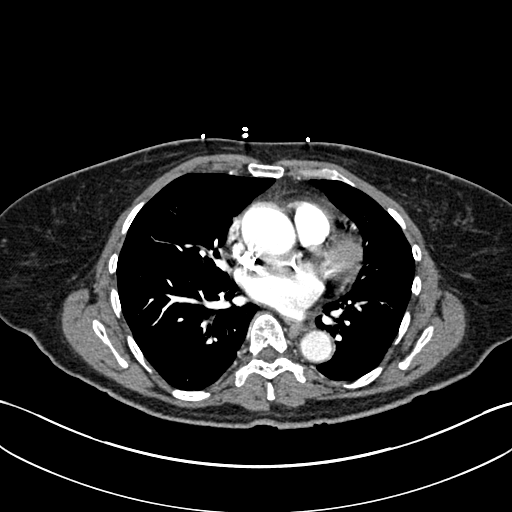
[im 102/222  lung]
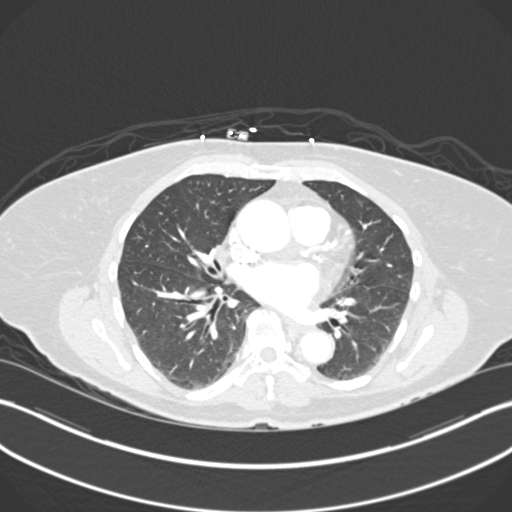
[im 111/222  mediastinal]
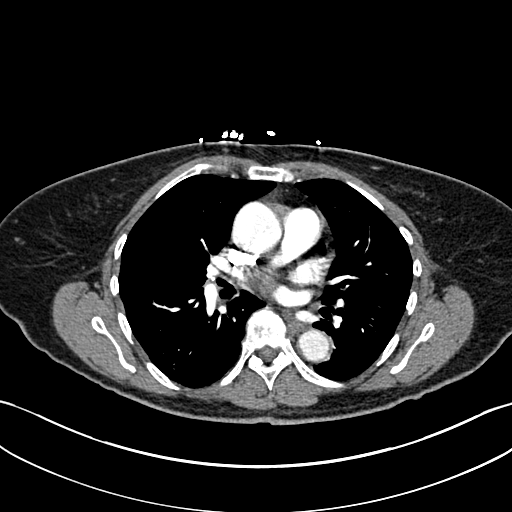
[im 125/222  lung]
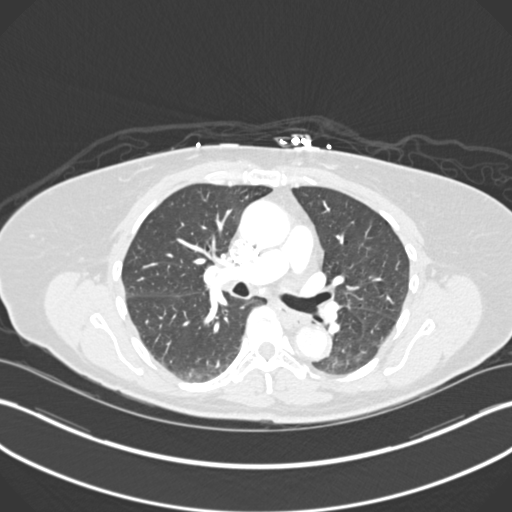
[im 139/222  mediastinal]
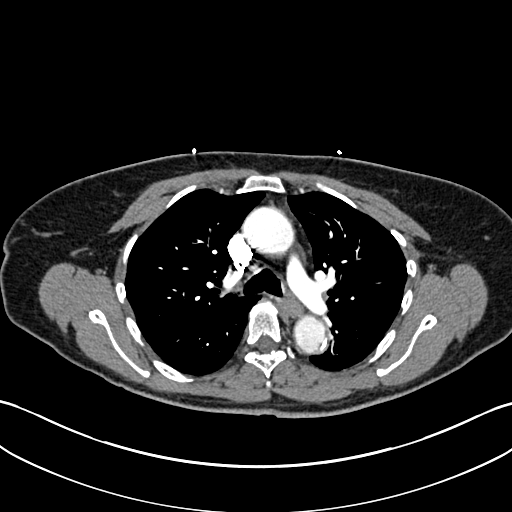
[im 148/222  lung]
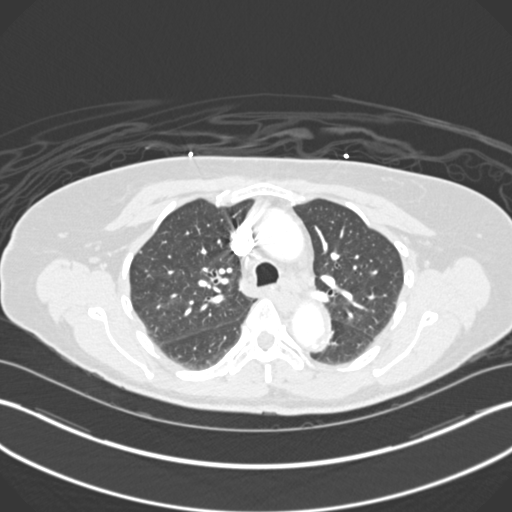
[im 152/222  mediastinal]
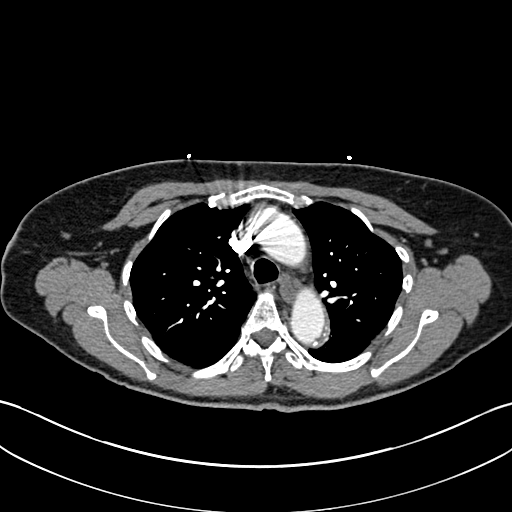
[im 166/222  lung]
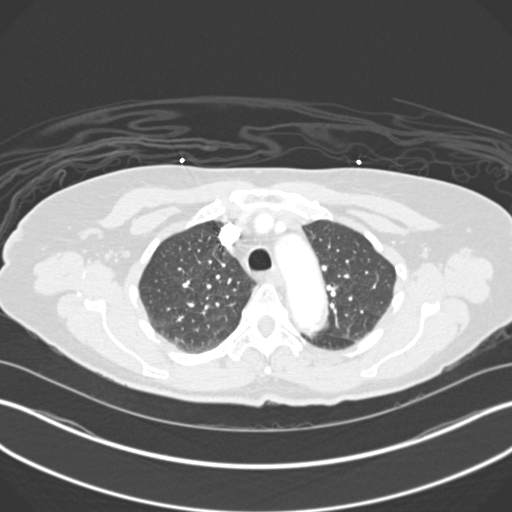
[im 180/222  mediastinal]
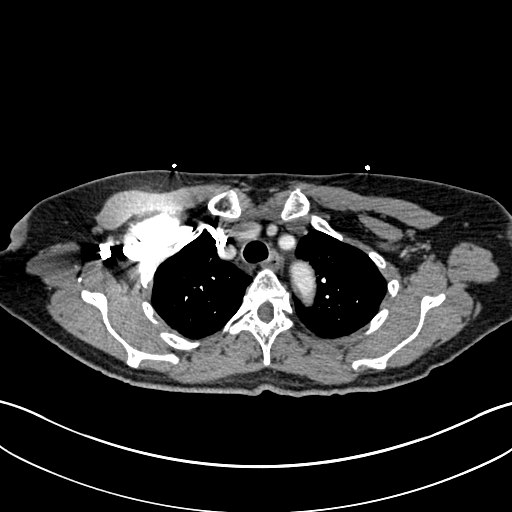
[im 194/222  lung]
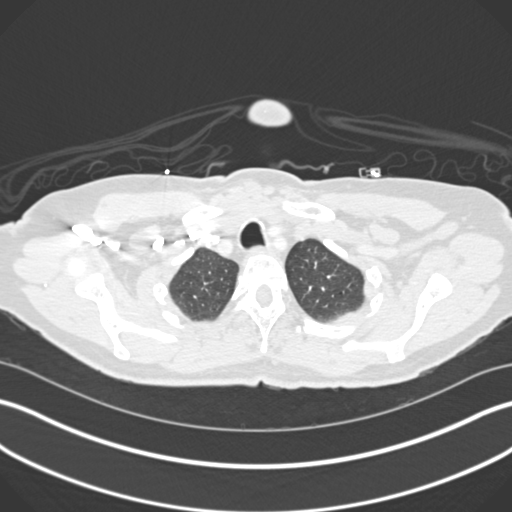
[im 208/222  mediastinal]
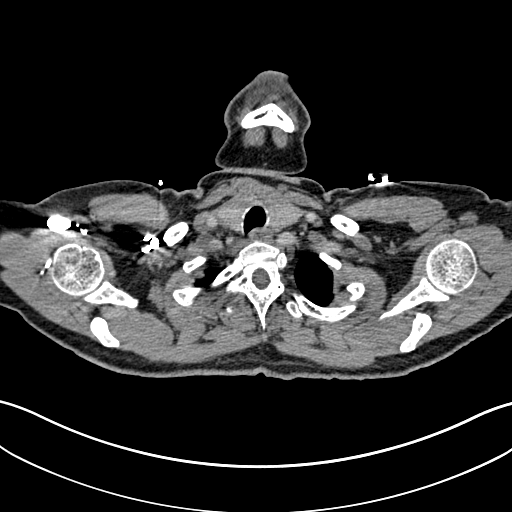

[18 of 33 positions shown; findings below may reference images not displayed]

FINDINGS: No pneumothorax or pleural effusion is noted. Calcified granuloma is
noted in right lateral costophrenic sulcus. No acute pulmonary
disease is noted. There is no evidence of pulmonary embolus. There
is no evidence of thoracic aortic dissection. No mediastinal mass or
adenopathy is noted. Visualized portion of upper abdomen appears
normal. No significant osseous abnormality is noted in the chest.

Review of the MIP images confirms the above findings.
IMPRESSION: No evidence of pulmonary embolus. No acute cardiopulmonary
abnormality seen.

## 2016-07-30 ENCOUNTER — Other Ambulatory Visit (HOSPITAL_COMMUNITY): Payer: Self-pay | Admitting: Family Medicine

## 2016-07-30 DIAGNOSIS — I209 Angina pectoris, unspecified: Secondary | ICD-10-CM

## 2016-08-12 ENCOUNTER — Encounter (HOSPITAL_COMMUNITY)
Admission: RE | Admit: 2016-08-12 | Discharge: 2016-08-12 | Disposition: A | Discharge status: Home / Self Care | Payer: Medicaid Other | Source: Ambulatory Visit | Attending: Family Medicine | Admitting: Family Medicine

## 2016-08-12 ENCOUNTER — Encounter (HOSPITAL_COMMUNITY): Payer: Self-pay

## 2016-08-12 DIAGNOSIS — I209 Angina pectoris, unspecified: Secondary | ICD-10-CM | POA: Insufficient documentation

## 2016-08-12 LAB — NM MYOCAR MULTI W/SPECT W/WALL MOTION / EF
CSEPEW: 1 METS
Exercise duration (min): 0 min
Exercise duration (sec): 0 s
MPHR: 162 {beats}/min
Peak HR: 108 {beats}/min
Percent HR: 66 %
Rest HR: 82 {beats}/min

## 2016-08-12 MED ORDER — TECHNETIUM TC 99M TETROFOSMIN IV KIT
10.0000 | PACK | Freq: Once | INTRAVENOUS | Status: AC
  Administered 2016-08-12: 10 via INTRAVENOUS

## 2016-08-12 MED ORDER — TECHNETIUM TC 99M TETROFOSMIN IV KIT
30.0000 | PACK | Freq: Once | INTRAVENOUS | Status: AC | PRN
  Administered 2016-08-12: 30 via INTRAVENOUS

## 2016-08-12 MED ORDER — REGADENOSON 0.4 MG/5ML IV SOLN
0.4000 mg | Freq: Once | INTRAVENOUS | Status: AC
  Administered 2016-08-12: 0.4 mg via INTRAVENOUS

## 2016-08-12 MED ORDER — REGADENOSON 0.4 MG/5ML IV SOLN
INTRAVENOUS | Status: AC
  Filled 2016-08-12: qty 5

## 2017-05-18 ENCOUNTER — Other Ambulatory Visit: Payer: Self-pay | Admitting: Internal Medicine

## 2017-05-18 DIAGNOSIS — N644 Mastodynia: Secondary | ICD-10-CM

## 2017-05-22 ENCOUNTER — Ambulatory Visit
Admission: RE | Admit: 2017-05-22 | Discharge: 2017-05-22 | Disposition: A | Discharge status: Home / Self Care | Payer: Medicaid Other | Source: Ambulatory Visit | Attending: Internal Medicine | Admitting: Internal Medicine

## 2017-05-22 ENCOUNTER — Other Ambulatory Visit: Payer: Self-pay

## 2017-05-22 DIAGNOSIS — N644 Mastodynia: Secondary | ICD-10-CM

## 2017-05-28 ENCOUNTER — Ambulatory Visit: Payer: Medicaid Other

## 2017-05-28 ENCOUNTER — Ambulatory Visit
Admission: RE | Admit: 2017-05-28 | Discharge: 2017-05-28 | Disposition: A | Discharge status: Home / Self Care | Payer: Medicaid Other | Source: Ambulatory Visit | Attending: Internal Medicine | Admitting: Internal Medicine

## 2018-05-14 ENCOUNTER — Other Ambulatory Visit: Payer: Self-pay | Admitting: Internal Medicine

## 2018-10-28 ENCOUNTER — Other Ambulatory Visit: Payer: Self-pay | Admitting: Internal Medicine

## 2018-10-28 DIAGNOSIS — Z1231 Encounter for screening mammogram for malignant neoplasm of breast: Secondary | ICD-10-CM

## 2018-12-30 ENCOUNTER — Ambulatory Visit: Payer: Medicaid Other

## 2019-07-30 ENCOUNTER — Ambulatory Visit: Payer: Medicaid Other | Attending: Internal Medicine

## 2019-07-30 DIAGNOSIS — Z23 Encounter for immunization: Secondary | ICD-10-CM

## 2019-07-30 NOTE — Progress Notes (Signed)
   Covid-[facility] Vaccination Clinic  Name:  Evelyn Mcdaniel    MRN: [id number] DOB: 10-31-1955  07/30/2019  [name] was observed post Covid-19 immunization for 15 minutes without incident. She was provided with Vaccine Information Sheet and instruction to access the V-Safe system.   [name] was instructed to call 911 with any severe reactions post vaccine: Marland Kitchen Difficulty breathing  . Swelling of face and throat  . A fast heartbeat  . A bad rash all over body  . Dizziness and weakness   Immunizations Administered    Name Date Dose VIS Date Route   Pfizer COVID-19 Vaccine 07/30/2019 11:33 AM 0.3 mL 05/25/2018 Intramuscular   Manufacturer: [place]   Lot: [id number]   [place]: [id number]

## 2019-08-25 ENCOUNTER — Ambulatory Visit: Payer: Medicaid Other | Attending: Internal Medicine

## 2019-08-25 ENCOUNTER — Ambulatory Visit: Payer: Medicaid Other

## 2019-08-25 DIAGNOSIS — Z23 Encounter for immunization: Secondary | ICD-10-CM

## 2019-08-25 NOTE — Progress Notes (Signed)
   Covid-19 Vaccination Clinic  Name:  BROOK ZEINER    MRN: [id number] DOB: [birth year]  08/25/2019  Ms. Korst was observed post Covid-19 immunization for 15 minutes without incident. She was provided with Vaccine Information Sheet and instruction to access the V-Safe system.   Ms. Migdal was instructed to call 911 with any severe reactions post vaccine: Marland Kitchen Difficulty breathing  . Swelling of face and throat  . A fast heartbeat  . A bad rash all over body  . Dizziness and weakness   Immunizations Administered    Name Date Dose VIS Date Route   Pfizer COVID-19 Vaccine 08/25/2019  8:23 AM 0.3 mL 05/25/2018 Intramuscular   Manufacturer: [employer]   Lot: [id number]   [place]: [id number]

## 2019-08-27 ENCOUNTER — Ambulatory Visit: Payer: Medicaid Other

## 2020-02-11 ENCOUNTER — Ambulatory Visit: Payer: Medicaid Other

## 2020-03-17 ENCOUNTER — Ambulatory Visit: Payer: Medicaid Other | Attending: Internal Medicine

## 2020-03-17 DIAGNOSIS — Z23 Encounter for immunization: Secondary | ICD-10-CM

## 2020-03-17 NOTE — Progress Notes (Signed)
° °  Covid-19 Vaccination Clinic  Name:  WHITLEY PATCHEN    MRN: [id number] DOB: Aug 12, 1955  03/17/2020  Ms. Scurlock was observed post Covid-19 immunization for 15 minutes without incident. She was provided with Vaccine Information Sheet and instruction to access the V-Safe system.   Ms. Mcconaughy was instructed to call 911 with any severe reactions post vaccine:  Difficulty breathing   Swelling of face and throat   A fast heartbeat   A bad rash all over body   Dizziness and weakness   Immunizations Administered    Name Date Dose VIS Date Route   Pfizer COVID-19 Vaccine 03/17/2020  9:47 AM 0.3 mL 01/18/2020 Intramuscular   Manufacturer: [place]   Lot: [id number]   [place]: [id number]

## 2020-05-31 ENCOUNTER — Other Ambulatory Visit: Payer: Self-pay | Admitting: Internal Medicine

## 2020-05-31 DIAGNOSIS — Z1231 Encounter for screening mammogram for malignant neoplasm of breast: Secondary | ICD-10-CM

## 2020-07-23 ENCOUNTER — Ambulatory Visit: Payer: Medicaid Other

## 2020-09-05 ENCOUNTER — Ambulatory Visit: Payer: Medicaid Other

## 2021-05-30 ENCOUNTER — Encounter: Payer: Self-pay | Admitting: Gastroenterology

## 2021-07-29 ENCOUNTER — Other Ambulatory Visit: Payer: Self-pay | Admitting: Internal Medicine

## 2021-07-29 DIAGNOSIS — Z1231 Encounter for screening mammogram for malignant neoplasm of breast: Secondary | ICD-10-CM

## 2021-07-30 ENCOUNTER — Ambulatory Visit
Admission: RE | Admit: 2021-07-30 | Discharge: 2021-07-30 | Disposition: A | Discharge status: Home / Self Care | Payer: Medicaid Other | Source: Ambulatory Visit | Attending: Internal Medicine | Admitting: Internal Medicine

## 2021-07-30 DIAGNOSIS — Z1231 Encounter for screening mammogram for malignant neoplasm of breast: Secondary | ICD-10-CM

## 2024-04-12 ENCOUNTER — Other Ambulatory Visit: Payer: Self-pay | Admitting: Internal Medicine

## 2024-04-12 DIAGNOSIS — Z1231 Encounter for screening mammogram for malignant neoplasm of breast: Secondary | ICD-10-CM

## 2024-04-29 ENCOUNTER — Ambulatory Visit

## 2024-05-06 ENCOUNTER — Inpatient Hospital Stay: Admission: RE | Admit: 2024-05-06

## 2024-05-06 DIAGNOSIS — Z1231 Encounter for screening mammogram for malignant neoplasm of breast: Secondary | ICD-10-CM
